# Patient Record
Sex: Male | Born: 1975
Health system: Southern US, Community
[De-identification: ages and names within clinical notes are randomized; demographics above are authoritative.]

## PROBLEM LIST (undated history)

## (undated) DIAGNOSIS — K219 Gastro-esophageal reflux disease without esophagitis: Secondary | ICD-10-CM

## (undated) HISTORY — PX: FOOT SURGERY: SHX648

---

## 2012-09-27 ENCOUNTER — Encounter (HOSPITAL_COMMUNITY): Payer: Self-pay | Admitting: *Deleted

## 2012-09-27 ENCOUNTER — Emergency Department (HOSPITAL_COMMUNITY): Payer: BC Managed Care – PPO

## 2012-09-27 ENCOUNTER — Emergency Department (HOSPITAL_COMMUNITY)
Admission: EM | Admit: 2012-09-27 | Discharge: 2012-09-27 | Disposition: A | Discharge status: Home Health | Payer: BC Managed Care – PPO | Attending: Emergency Medicine | Admitting: Emergency Medicine

## 2012-09-27 DIAGNOSIS — F17200 Nicotine dependence, unspecified, uncomplicated: Secondary | ICD-10-CM | POA: Insufficient documentation

## 2012-09-27 DIAGNOSIS — Z8719 Personal history of other diseases of the digestive system: Secondary | ICD-10-CM | POA: Insufficient documentation

## 2012-09-27 DIAGNOSIS — Y929 Unspecified place or not applicable: Secondary | ICD-10-CM | POA: Insufficient documentation

## 2012-09-27 DIAGNOSIS — S20212A Contusion of left front wall of thorax, initial encounter: Secondary | ICD-10-CM

## 2012-09-27 DIAGNOSIS — S20219A Contusion of unspecified front wall of thorax, initial encounter: Secondary | ICD-10-CM | POA: Insufficient documentation

## 2012-09-27 DIAGNOSIS — Y939 Activity, unspecified: Secondary | ICD-10-CM | POA: Insufficient documentation

## 2012-09-27 DIAGNOSIS — IMO0002 Reserved for concepts with insufficient information to code with codable children: Secondary | ICD-10-CM | POA: Insufficient documentation

## 2012-09-27 HISTORY — DX: Gastro-esophageal reflux disease without esophagitis: K21.9

## 2012-09-27 MED ORDER — METHOCARBAMOL 500 MG PO TABS: 500.0000 mg | ORAL_TABLET | Freq: Two times a day (BID) | ORAL | Status: DC

## 2012-09-27 MED ORDER — OXYCODONE-ACETAMINOPHEN 5-325 MG PO TABS: 2.0000 | ORAL_TABLET | ORAL | Status: DC | PRN

## 2012-09-27 NOTE — ED Notes (Signed)
Pt discharged to home with family. NAD.  
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
## 2017-05-07 ENCOUNTER — Emergency Department (HOSPITAL_COMMUNITY): Payer: 59

## 2017-05-07 ENCOUNTER — Encounter (HOSPITAL_COMMUNITY): Payer: Self-pay | Admitting: *Deleted

## 2017-05-07 ENCOUNTER — Emergency Department (HOSPITAL_COMMUNITY)
Admission: EM | Admit: 2017-05-07 | Discharge: 2017-05-07 | Disposition: A | Discharge status: Home / Self Care | Payer: 59 | Attending: Emergency Medicine | Admitting: Emergency Medicine

## 2017-05-07 DIAGNOSIS — H93292 Other abnormal auditory perceptions, left ear: Secondary | ICD-10-CM | POA: Insufficient documentation

## 2017-05-07 DIAGNOSIS — H65192 Other acute nonsuppurative otitis media, left ear: Secondary | ICD-10-CM

## 2017-05-07 DIAGNOSIS — R42 Dizziness and giddiness: Secondary | ICD-10-CM

## 2017-05-07 DIAGNOSIS — F17210 Nicotine dependence, cigarettes, uncomplicated: Secondary | ICD-10-CM | POA: Diagnosis not present

## 2017-05-07 DIAGNOSIS — R110 Nausea: Secondary | ICD-10-CM | POA: Diagnosis not present

## 2017-05-07 DIAGNOSIS — R05 Cough: Secondary | ICD-10-CM | POA: Insufficient documentation

## 2017-05-07 DIAGNOSIS — R2681 Unsteadiness on feet: Secondary | ICD-10-CM | POA: Diagnosis not present

## 2017-05-07 DIAGNOSIS — R531 Weakness: Secondary | ICD-10-CM | POA: Diagnosis not present

## 2017-05-07 DIAGNOSIS — K625 Hemorrhage of anus and rectum: Secondary | ICD-10-CM | POA: Diagnosis not present

## 2017-05-07 LAB — CBC
HCT: 42.6 % (ref 39.0–52.0)
Hemoglobin: 14.3 g/dL (ref 13.0–17.0)
MCH: 29.5 pg (ref 26.0–34.0)
MCHC: 33.6 g/dL (ref 30.0–36.0)
MCV: 88 fL (ref 78.0–100.0)
Platelets: 179 10*3/uL (ref 150–400)
RBC: 4.84 MIL/uL (ref 4.22–5.81)
RDW: 13.3 % (ref 11.5–15.5)
WBC: 6.3 10*3/uL (ref 4.0–10.5)

## 2017-05-07 LAB — URINALYSIS, ROUTINE W REFLEX MICROSCOPIC
BILIRUBIN URINE: NEGATIVE
Glucose, UA: NEGATIVE mg/dL
HGB URINE DIPSTICK: NEGATIVE
Ketones, ur: NEGATIVE mg/dL
Leukocytes, UA: NEGATIVE
Nitrite: NEGATIVE
Protein, ur: NEGATIVE mg/dL
SPECIFIC GRAVITY, URINE: 1.005 (ref 1.005–1.030)
pH: 6 (ref 5.0–8.0)

## 2017-05-07 LAB — BASIC METABOLIC PANEL
Anion gap: 8 (ref 5–15)
BUN: 12 mg/dL (ref 6–20)
CALCIUM: 9.6 mg/dL (ref 8.9–10.3)
CO2: 21 mmol/L — AB (ref 22–32)
CREATININE: 0.8 mg/dL (ref 0.61–1.24)
Chloride: 107 mmol/L (ref 101–111)
GFR calc Af Amer: >60 mL/min (ref >60)
GFR calc non Af Amer: >60 mL/min (ref >60)
Glucose, Bld: 104 mg/dL — ABNORMAL HIGH (ref 65–99)
Potassium: 4 mmol/L (ref 3.5–5.1)
Sodium: 136 mmol/L (ref 135–145)

## 2017-05-07 MED ORDER — MECLIZINE HCL 25 MG PO TABS: 25.0000 mg | ORAL_TABLET | Freq: Three times a day (TID) | ORAL | 0 refills | Status: DC | PRN

## 2017-05-07 MED ORDER — DIAZEPAM 5 MG PO TABS: 5.0000 mg | ORAL_TABLET | Freq: Two times a day (BID) | ORAL | 0 refills | Status: DC | PRN

## 2017-05-07 MED ORDER — MECLIZINE HCL 25 MG PO TABS
50.0000 mg | ORAL_TABLET | Freq: Once | ORAL | Status: AC
  Administered 2017-05-07: 50 mg via ORAL
  Filled 2017-05-07: qty 2

## 2017-05-07 MED ORDER — AMOXICILLIN-POT CLAVULANATE 875-125 MG PO TABS: 1.0000 | ORAL_TABLET | Freq: Two times a day (BID) | ORAL | 0 refills | Status: AC

## 2017-05-07 MED ORDER — SODIUM CHLORIDE 0.9 % IV BOLUS (SEPSIS)
1000.0000 mL | Freq: Once | INTRAVENOUS | Status: AC
  Administered 2017-05-07: 1000 mL via INTRAVENOUS

## 2017-05-07 MED ORDER — ONDANSETRON HCL 4 MG/2ML IJ SOLN
4.0000 mg | Freq: Once | INTRAMUSCULAR | Status: AC
  Administered 2017-05-07: 4 mg via INTRAVENOUS
  Filled 2017-05-07: qty 2

## 2017-05-07 NOTE — Discharge Instructions (Addendum)
Fill the Augmentin prescription if you are having continuing ear symptoms in 2 days.

## 2017-05-07 NOTE — ED Notes (Signed)
Pt. currently at MRI .  

## 2017-05-07 NOTE — ED Notes (Signed)
Pt A&Ox4, ambulatory at d/c with independent steady gait, NAD. Pt verbalized understanding of d/c instructions and follow up care. 

## 2017-05-07 NOTE — ED Notes (Signed)
Pt has now returned from MRI

## 2017-05-07 NOTE — ED Provider Notes (Signed)
[facility] Provider Note   CSN: [id number] Arrival date & time: 05/07/17  [id number]     History   Chief Complaint Chief Complaint  Patient presents with  . Dizziness    HPI Ernest Lawson is a 41 y.o. male.  HPI  Presents with concern for dizziness starting yesterday around 2 PM. Dizziness feels like can't keep balance, like equilibrium off.  Walking seems to make it worse.  Not really worse with head movements, standing. Feels the same when laying down as well.  Feels some lightheadedness.  Not as intense as it was when first feeling it this morning.    Left hand falling asleep last night but was laying on that side. Other than that no numbness, weakness, difficulty speaking.  Can walk but feels lightheadedness with walking.  No change in vision.  No ear pain but having muffled sensation in left ear.   No chest pain, no dyspnea, no vomiting but did have some nausea, no headache. No fevers, black stool.  Bright red blood on tissue paper and in toilet bowl, not mixed in with stool. No diarrhea.  No medication changes, only takes nexium. Drinks alcohol, but not prior to this (on Sunday), no drugs. Smokes cigarettes  Dad has hx of COPD and stroke hx in his 5550s   Past Medical History:  Diagnosis Date  . Acid reflux     There are no active problems to display for this patient.   Past Surgical History:  Procedure Laterality Date  . FOOT SURGERY         Home Medications    Prior to Admission medications   Medication Sig Start Date End Date Taking? Authorizing Provider  amoxicillin-clavulanate (AUGMENTIN) 875-125 MG tablet Take 1 tablet 2 (two) times daily for 7 days by mouth. 05/07/17 05/14/17  Alvira MondaySchlossman, Shatona Andujar, MD  diazepam (VALIUM) 5 MG tablet Take 1 tablet (5 mg total) 2 (two) times daily as needed by mouth (dizziness). 05/07/17   Alvira MondaySchlossman, Kyl Givler, MD  esomeprazole (NEXIUM) 20 MG capsule Take 20 mg by mouth daily before breakfast.     [provider]  ibuprofen (ADVIL,MOTRIN) 200 MG tablet Take 200 mg by mouth every 6 (six) hours as needed for pain.    [provider]  meclizine (ANTIVERT) 25 MG tablet Take 1 tablet (25 mg total) 3 (three) times daily as needed by mouth for dizziness. 05/07/17   Alvira MondaySchlossman, Akeila Lana, MD  methocarbamol (ROBAXIN) 500 MG tablet Take 1 tablet (500 mg total) by mouth 2 (two) times daily. 09/27/12   Lorre NickAllen, Anthony, MD  oxyCODONE-acetaminophen (PERCOCET/ROXICET) 5-325 MG per tablet Take 2 tablets by mouth every 4 (four) hours as needed for pain. 09/27/12   Lorre NickAllen, Anthony, MD    Family History History reviewed. No pertinent family history.  Social History Social History   Tobacco Use  . Smoking status: Current Every Day Smoker  Substance Use Topics  . Alcohol use: Yes  . Drug use: No     Allergies   Patient has no known allergies.   Review of Systems Review of Systems  Constitutional: Negative for fever.  HENT: Positive for congestion. Negative for ear pain (no ear pain but has muffled sensation), sinus pain and sore throat.   Eyes: Negative for visual disturbance.  Respiratory: Positive for cough. Negative for shortness of breath.   Cardiovascular: Negative for chest pain.  Gastrointestinal: Positive for anal bleeding and nausea. Negative for abdominal pain, constipation, diarrhea and vomiting.  Genitourinary: Negative  for difficulty urinating and dysuria.  Musculoskeletal: Negative for back pain and neck stiffness.  Skin: Negative for rash.  Neurological: Positive for dizziness and light-headedness. Negative for syncope, speech difficulty, weakness, numbness and headaches.     Physical Exam Updated Vital Signs BP 116/84   Pulse 65   Temp 98.6 F (37 C) (Oral)   Resp 20   SpO2 99%   Physical Exam  Constitutional: He is oriented to person, place, and time. He appears well-developed and well-nourished. No distress.  HENT:  Head: Normocephalic and atraumatic.   TM with effusion on left  Eyes: Conjunctivae and EOM are normal.  Neck: Normal range of motion.  Cardiovascular: Normal rate, regular rhythm, normal heart sounds and intact distal pulses. Exam reveals no gallop and no friction rub.  No murmur heard. Pulmonary/Chest: Effort normal and breath sounds normal. No respiratory distress. He has no wheezes. He has no rales.  Abdominal: Soft. He exhibits no distension. There is no tenderness. There is no guarding.  Musculoskeletal: He exhibits no edema.  Neurological: He is alert and oriented to person, place, and time. He has normal strength. No cranial nerve deficit or sensory deficit. He displays a negative Romberg sign. Coordination and gait normal. GCS eye subscore is 4. GCS verbal subscore is 5. GCS motor subscore is 6.  Skin: Skin is warm and dry. He is not diaphoretic.  Nursing note and vitals reviewed.    ED Treatments / Results  Labs (all labs ordered are listed, but only abnormal results are displayed) Labs Reviewed  BASIC METABOLIC PANEL - Abnormal; Notable for the following components:      Result Value   CO2 21 (*)    Glucose, Bld 104 (*)    All other components within normal limits  URINALYSIS, ROUTINE W REFLEX MICROSCOPIC - Abnormal; Notable for the following components:   Color, Urine STRAW (*)    All other components within normal limits  CBC  CBG MONITORING, ED    EKG  EKG Interpretation  Date/Time:  Wednesday May 07 2017 09:05:35 EST Ventricular Rate:  83 PR Interval:  154 QRS Duration: 88 QT Interval:  356 QTC Calculation: 418 R Axis:   59 Text Interpretation:  Normal sinus rhythm Normal ECG No significant change since last tracing Confirmed by Alvira MondaySchlossman, Sueo Cullen ([id number]) on 05/07/2017 9:25:40 AM       Radiology Mr Brain Wo Contrast  Result Date: 05/07/2017 CLINICAL DATA:  Vertigo EXAM: MRI HEAD WITHOUT CONTRAST TECHNIQUE: Multiplanar, multiecho pulse sequences of the brain and surrounding structures were  obtained without intravenous contrast. COMPARISON:  None. FINDINGS: Brain: No acute infarction, hemorrhage, hydrocephalus, extra-axial collection or mass lesion. Solitary 4 mm left parietal deep white matter hyperintensity. Otherwise white matter normal. Vascular: Normal arterial flow voids Skull and upper cervical spine: 10 mm cyst in the left parietal bone has a benign appearance. Sinuses/Orbits: Negative Other: None IMPRESSION: No acute abnormality. Small solitary white matter hyperintensity on left is nonspecific. Differential includes chronic ischemia, complicated migraine headache, and demyelinating disease. Electronically Signed   By: Marlan Palauharles  Clark M.D.   On: 05/07/2017 13:38    Procedures Procedures (including critical care time)  Medications Ordered in ED Medications  sodium chloride 0.9 % bolus 1,000 mL (0 mLs Intravenous Stopped 05/07/17 1154)  meclizine (ANTIVERT) tablet 50 mg (50 mg Oral Given 05/07/17 1007)  ondansetron (ZOFRAN) injection 4 mg (4 mg Intravenous Given 05/07/17 1011)     Initial Impression / Assessment and Plan / ED Course  I  have reviewed the triage vital signs and the nursing notes.  Pertinent labs & imaging results that were available during my care of the patient were reviewed by me and considered in my medical decision making (see chart for details).     40 year old male with a history of GERD presents with concern for sensation of disequilibrium and dizziness.  EKG shows no acute findings.  No acute changes on telemetry.  No chest pain or shortness of breath and have low suspicion for pulmonary embolus or ACS.  CBC shows a normal hemoglobin.  Electrolytes are within normal limits.  Patient was given meclizine and a liter of fluid, and on reevaluation continues to have sensation of dizziness, disequilibrium, and felt wobbly while trying to ambulate.  While initial Neuro exam was normal, appeared unsteady while he was ambulating to bathroom, and given he does not  have classic peripheral vertigo symptoms (no severe room spinning, no worsening with standing or head movements, continued symptoms at rest) and that his symptoms did not improve with meclizine, ordered MRI to evaluate for potential central etiology of vertigo/dysequilibrium.  MRI Brain shows no acute abnormalities.  Does show a small solitary white matter hyperintensity on the left which is nonspecific.  Given this finding, recommend outpatient neurology follow-up.  Symptoms of disequilibrium are likely secondary to a peripheral vertigo given no signs of other abnormalities on MRI.  He does have nasal congestion and signs of a left ear effusion.  Given Augmentin to take if he develops ear pain, fevers or has continuing symptoms in 48 hours for possible bacterial acute otitis media.  At this time suspect more likely viral etiology.  Gave a prescription for meclizine, as well as 4 tablets of Valium to use as needed for vertigo if it continues after meclizine use.  Recommend follow-up with primary care physician as well as neurology given left parietal deep white matter hyperintensity. Patient discharged in stable condition with understanding of reasons to return.     Final Clinical Impressions(s) / ED Diagnoses   Final diagnoses:  Vertigo  Acute effusion of left ear    ED Discharge Orders        Ordered    amoxicillin-clavulanate (AUGMENTIN) 875-125 MG tablet  2 times daily     05/07/17 1442    meclizine (ANTIVERT) 25 MG tablet  3 times daily PRN     05/07/17 1442    diazepam (VALIUM) 5 MG tablet  2 times daily PRN     05/07/17 1442       Alvira Monday, MD 05/07/17 1820

## 2017-05-07 NOTE — ED Triage Notes (Signed)
Pt reports dizziness that started yesterday. Denies any pain just feels weak and feels like "room is spinning" and unsteady on his feet.

## 2017-05-08 ENCOUNTER — Encounter: Payer: Self-pay | Admitting: Neurology

## 2017-07-08 DIAGNOSIS — Z23 Encounter for immunization: Secondary | ICD-10-CM | POA: Diagnosis not present

## 2017-08-19 ENCOUNTER — Encounter: Payer: Self-pay | Admitting: Neurology

## 2017-08-19 ENCOUNTER — Ambulatory Visit: Payer: 59 | Admitting: Neurology

## 2017-08-19 VITALS — HR 83 | Ht 70.0 in | Wt 236.2 lb

## 2017-08-19 DIAGNOSIS — G44219 Episodic tension-type headache, not intractable: Secondary | ICD-10-CM | POA: Diagnosis not present

## 2017-08-19 DIAGNOSIS — H8110 Benign paroxysmal vertigo, unspecified ear: Secondary | ICD-10-CM

## 2017-08-19 DIAGNOSIS — F17200 Nicotine dependence, unspecified, uncomplicated: Secondary | ICD-10-CM

## 2017-08-19 NOTE — Progress Notes (Signed)
NEUROLOGY CONSULTATION NOTE  Ernest Lawson MRN: [id number] DOB: 1975-12-29  Referring provider: ED referral Primary care provider: no PCP  Reason for consult:  Dizziness, abnormal brain MRI  HISTORY OF PRESENT ILLNESS: Ernest Lawson is a 42 year old male who presents for vertigo.  History supplemented by ED note.  On 05/06/17, he developed dizziness, described as a spinning and lightheaded sensation.  It was most pronounced when he was working on the ladder or looking up.  It lasted several seconds to a couple of minutes.  He had some mild nausea but no vomiting.  Otherwise, he denied unilateral numbness, weakness, visual disturbance or slurred speech.  He has occasional non-throbbing dull frontal headaches that occur about once a week or less and quickly responds to over the counter medication.  However, these dizzy spells were not associated with headache.  He denies history of migraines.  He presented to the ED the following day.  MRI of brain without contrast was personally reviewed and revealed a nonspecific punctate solitary hyperintense foci in the left parietal cerebral white matter.  Otherwise, unremarkable.  EKG was unremarkable.  He noted nasal congestion and exhibited signs of left ear effusion.  He was prescribed Augmentin if he develops ear pain, fever or continued symptoms for 48 hours.  The dizziness lasted several weeks and then subsided.  Once in a while, he notes mild dizziness when he looks up while working.    For several years, he has had intermittent left proximal upper extremity pain that would radiated down the arm and associated with numbness.  No weakness or neck pain.  He said it started after injuring the left side of his upper chest.    PAST MEDICAL HISTORY: Past Medical History:  Diagnosis Date  . Acid reflux     PAST SURGICAL HISTORY: Past Surgical History:  Procedure Laterality Date  . FOOT SURGERY      MEDICATIONS: Current Outpatient Medications on  File Prior to Visit  Medication Sig Dispense Refill  . aspirin EC 81 MG tablet Take 81 mg by mouth daily.    . diazepam (VALIUM) 5 MG tablet Take 1 tablet (5 mg total) 2 (two) times daily as needed by mouth (dizziness). (Patient not taking: Reported on 08/19/2017) 4 tablet 0  . esomeprazole (NEXIUM) 20 MG capsule Take 20 mg by mouth daily before breakfast.    . ibuprofen (ADVIL,MOTRIN) 200 MG tablet Take 200 mg by mouth every 6 (six) hours as needed for pain.    . meclizine (ANTIVERT) 25 MG tablet Take 1 tablet (25 mg total) 3 (three) times daily as needed by mouth for dizziness. (Patient not taking: Reported on 08/19/2017) 30 tablet 0  . methocarbamol (ROBAXIN) 500 MG tablet Take 1 tablet (500 mg total) by mouth 2 (two) times daily. (Patient not taking: Reported on 08/19/2017) 20 tablet 0  . oxyCODONE-acetaminophen (PERCOCET/ROXICET) 5-325 MG per tablet Take 2 tablets by mouth every 4 (four) hours as needed for pain. (Patient not taking: Reported on 08/19/2017) 15 tablet 0   No current facility-administered medications on file prior to visit.     ALLERGIES: No Known Allergies  FAMILY HISTORY: Family History  Problem Relation Age of Onset  . Throat cancer Mother   . High blood pressure Mother   . Stroke Father     SOCIAL HISTORY: Social History   Socioeconomic History  . Marital status: Married    Spouse name: Judeth Cornfield  . Number of children: Not on file  . Years  of education: Not on file  . Highest education level: 11th grade  Social Needs  . Financial resource strain: Not on file  . Food insecurity - worry: Not on file  . Food insecurity - inability: Not on file  . Transportation needs - medical: Not on file  . Transportation needs - non-medical: Not on file  Occupational History  . Occupation: pipe fitter    CommentHydrologist con [employer]  . Smoking status: Current Every Day Smoker  . Smokeless tobacco: Never Used  Substance and Sexual Activity  . Alcohol use: Yes     Alcohol/week: 1.2 oz    Types: 1 Cans of beer, 1 Shots of liquor per week    Comment: 1-2 beers or cocktails a week  . Drug use: No  . Sexual activity: Not on file  Other Topics Concern  . Not on file  Social History Narrative   Patient is right-handed. He is married with 2 children. He drinks 2 cups of coffee a day, and occassionally tea or soda, He is very active at work.    REVIEW OF SYSTEMS: Constitutional: No fevers, chills, or sweats, no generalized fatigue, change in appetite Eyes: No visual changes, double vision, eye pain Ear, nose and throat: No hearing loss, ear pain, nasal congestion, sore throat Cardiovascular: No chest pain, palpitations Respiratory:  No shortness of breath at rest or with exertion, wheezes GastrointestinaI: No nausea, vomiting, diarrhea, abdominal pain, fecal incontinence Genitourinary:  No dysuria, urinary retention or frequency Musculoskeletal:  No neck pain, back pain Integumentary: No rash, pruritus, skin lesions Neurological: as above Psychiatric: No depression, insomnia, anxiety Endocrine: No palpitations, fatigue, diaphoresis, mood swings, change in appetite, change in weight, increased thirst Hematologic/Lymphatic:  No purpura, petechiae. Allergic/Immunologic: no itchy/runny eyes, nasal congestion, recent allergic reactions, rashes  PHYSICAL EXAM: Vitals:   08/19/17 0801  Pulse: 83  SpO2: 98%   General: No acute distress.  Patient appears well-groomed.  Head:  Normocephalic/atraumatic Eyes:  fundi examined but not visualized Neck: supple, no paraspinal tenderness, full range of motion Back: No paraspinal tenderness Heart: regular rate and rhythm Lungs: Clear to auscultation bilaterally. Vascular: No carotid bruits. Neurological Exam: Mental status: alert and oriented to person, place, and time, recent and remote memory intact, fund of knowledge intact, attention and concentration intact, speech fluent and not dysarthric, language  intact. Cranial nerves: CN I: not tested CN II: pupils equal, round and reactive to light, visual fields intact CN III, IV, VI:  full range of motion, no nystagmus, no ptosis CN V: facial sensation intact CN VII: upper and lower face symmetric CN VIII: hearing intact CN IX, X: gag intact, uvula midline CN XI: sternocleidomastoid and trapezius muscles intact CN XII: tongue midline Bulk & Tone: normal, no fasciculations. Motor:  5/5 throughout  Sensation: temperature and vibration sensation intact. Deep Tendon Reflexes:  2+ throughout, toes downgoing.  Finger to nose testing:  Without dysmetria.  Heel to shin:  Without dysmetria.  Gait:  Normal station and stride.  Able to turn and tandem walk. Romberg negative.  IMPRESSION: 1.  Benign paroxysmal positional vertigo, stable.   2.  Abnormal white matter foci on brain MRI is likely of no clinical significance.  It is nonspecific and not concerning for demyelinating disease, infection or vasculitis.  Possibly secondary to history of tobacco use.  With normal neurologic exam, no further workup warranted. 3.  Episodic tension-type headache, stable 4.  Tobacco use disorder  PLAN: No further workup needed.  Tobacco cessation counseling (CPT 99406): Tobacco use with no history of CAD, stroke, or cancer - Currently smoking 1 packs/day   - Patient was informed of the dangers of tobacco abuse including stroke, cancer, and MI, as well as benefits of tobacco cessation. - Patient is not willing to quit at this time. - Approximately 3 mins were spent counseling patient cessation techniques. We discussed various methods to help quit smoking, including deciding on a date to quit, joining a support group, pharmacological agents- nicotine gum/patch/lozenges, chantix.   Shon MilletAdam Samella Lucchetti, DO

## 2017-08-19 NOTE — Patient Instructions (Signed)
The dizziness is vertigo due to inner ear dysfunction.  T The MRI of brain shows a tiny spot of unknown clinical significance but does not look concerning.  There is also a small cyst which is benign and nothing concerning.  No further workup is needed.  As discussed, continue trying to quit smoking.

## 2017-08-21 ENCOUNTER — Ambulatory Visit: Payer: 59 | Admitting: Neurology

## 2018-01-27 DIAGNOSIS — R0789 Other chest pain: Secondary | ICD-10-CM | POA: Diagnosis not present

## 2018-03-12 DIAGNOSIS — H6123 Impacted cerumen, bilateral: Secondary | ICD-10-CM | POA: Diagnosis not present

## 2018-03-12 DIAGNOSIS — H81399 Other peripheral vertigo, unspecified ear: Secondary | ICD-10-CM | POA: Diagnosis not present

## 2018-03-12 DIAGNOSIS — H903 Sensorineural hearing loss, bilateral: Secondary | ICD-10-CM | POA: Diagnosis not present

## 2018-03-27 DIAGNOSIS — Z23 Encounter for immunization: Secondary | ICD-10-CM | POA: Diagnosis not present

## 2018-10-30 DIAGNOSIS — R0600 Dyspnea, unspecified: Secondary | ICD-10-CM | POA: Diagnosis not present

## 2018-10-30 DIAGNOSIS — J302 Other seasonal allergic rhinitis: Secondary | ICD-10-CM | POA: Diagnosis not present

## 2018-10-30 DIAGNOSIS — Z6834 Body mass index (BMI) 34.0-34.9, adult: Secondary | ICD-10-CM | POA: Diagnosis not present

## 2018-11-04 DIAGNOSIS — E785 Hyperlipidemia, unspecified: Secondary | ICD-10-CM | POA: Diagnosis not present

## 2018-11-04 DIAGNOSIS — Z6834 Body mass index (BMI) 34.0-34.9, adult: Secondary | ICD-10-CM | POA: Diagnosis not present

## 2018-11-04 DIAGNOSIS — R079 Chest pain, unspecified: Secondary | ICD-10-CM | POA: Diagnosis not present

## 2018-11-04 DIAGNOSIS — R0600 Dyspnea, unspecified: Secondary | ICD-10-CM | POA: Diagnosis not present

## 2018-11-05 ENCOUNTER — Telehealth: Payer: Self-pay | Admitting: *Deleted

## 2018-11-05 NOTE — Telephone Encounter (Signed)
YOUR CARDIOLOGY TEAM HAS ARRANGED FOR AN E-VISIT FOR YOUR APPOINTMENT - PLEASE REVIEW IMPORTANT INFORMATION BELOW SEVERAL DAYS PRIOR TO YOUR APPOINTMENT  Due to the recent COVID-19 pandemic, we are transitioning in-person office visits to tele-medicine visits in an effort to decrease unnecessary exposure to our patients, their families, and staff. These visits are billed to your insurance just like a normal visit is. We also encourage you to sign up for MyChart if you have not already done so. You will need a smartphone if possible. For patients that do not have this, we can still complete the visit using a regular telephone but do prefer a smartphone to enable video when possible. You may have a family member that lives with you that can help. If possible, we also ask that you have a blood pressure cuff and scale at home to measure your blood pressure, heart rate and weight prior to your scheduled appointment. Patients with clinical needs that need an in-person evaluation and testing will still be able to come to the office if absolutely necessary. If you have any questions, feel free to call our office.     YOUR PROVIDER WILL BE USING THE FOLLOWING PLATFORM TO COMPLETE YOUR VISIT: Staff: Please delete this text and fill in MyChart/Doximity/[email or web address].Me  . IF USING MYCHART - How to Download the MyChart App to Your SmartPhone   - If Apple, go to App Store and type in MyChart in the search bar and download the app. If Android, ask patient to go to Google Play Store and type in MyChart in the search bar and download the app. The app is free but as with any other app downloads, your phone may require you to verify saved payment information or Apple/Android password.  - You will need to then log into the app with your MyChart username and password, and select [facility] as your healthcare provider to link the account.  - When it is time for your visit, go to the MyChart app, find appointments, and click Begin  Video Visit. Be sure to Select Allow for your device to access the Microphone and Camera for your visit. You will then be connected, and your provider will be with you shortly.  **If you have any issues connecting or need assistance, please contact MyChart service desk [phone] ([phone])**  **If using a computer, in order to ensure the best quality for your visit, you will need to use either of the following Internet Browsers: Google Chrome or Microsoft Edge**  . IF USING DOXIMITY or [email or web address].ME - The staff will give you instructions on receiving your link to join the meeting the day of your visit.      2-3 DAYS BEFORE YOUR APPOINTMENT  You will receive a telephone call from one of our [facility] team members - your caller ID may say "Unknown caller." If this is a video visit, we will walk you through how to get the video launched on your phone. We will remind you check your blood pressure, heart rate and weight prior to your scheduled appointment. If you have an Apple Watch or Kardia, please upload any pertinent ECG strips the day before or morning of your appointment to MyChart. Our staff will also make sure you have reviewed the consent and agree to move forward with your scheduled tele-health visit.     THE DAY OF YOUR APPOINTMENT  Approximately 15 minutes prior to your scheduled appointment, you will receive a telephone call from one of HeartCare team -   your caller ID may say "Unknown caller."  Our staff will confirm medications, vital signs for the day and any symptoms you may be experiencing. Please have this information available prior to the time of visit start. It may also be helpful for you to have a pad of paper and pen handy for any instructions given during your visit. They will also walk you through joining the smartphone meeting if this is a video visit.    CONSENT FOR TELE-HEALTH VISIT - PLEASE REVIEW  I hereby voluntarily request, consent and authorize [facility] and  its employed or contracted physicians, physician assistants, nurse practitioners or other licensed health care professionals (the Practitioner), to provide me with telemedicine health care services (the "Services") as deemed necessary by the treating Practitioner. I acknowledge and consent to receive the Services by the Practitioner via telemedicine. I understand that the telemedicine visit will involve communicating with the Practitioner through live audiovisual communication technology and the disclosure of certain medical information by electronic transmission. I acknowledge that I have been given the opportunity to request an in-person assessment or other available alternative prior to the telemedicine visit and am voluntarily participating in the telemedicine visit.  I understand that I have the right to withhold or withdraw my consent to the use of telemedicine in the course of my care at any time, without affecting my right to future care or treatment, and that the Practitioner or I may terminate the telemedicine visit at any time. I understand that I have the right to inspect all information obtained and/or recorded in the course of the telemedicine visit and may receive copies of available information for a reasonable fee.  I understand that some of the potential risks of receiving the Services via telemedicine include:  Marland Kitchen Delay or interruption in medical evaluation due to technological equipment failure or disruption; . Information transmitted may not be sufficient (e.g. poor resolution of images) to allow for appropriate medical decision making by the Practitioner; and/or  . In rare instances, security protocols could fail, causing a breach of personal health information.  Furthermore, I acknowledge that it is my responsibility to provide information about my medical history, conditions and care that is complete and accurate to the best of my ability. I acknowledge that Practitioner's advice,  recommendations, and/or decision may be based on factors not within their control, such as incomplete or inaccurate data provided by me or distortions of diagnostic images or specimens that may result from electronic transmissions. I understand that the practice of medicine is not an exact science and that Practitioner makes no warranties or guarantees regarding treatment outcomes. I acknowledge that I will receive a copy of this consent concurrently upon execution via email to the email address I last provided but may also request a printed copy by calling the office of [facility].   Pt gives consent to virtual visit. I understand that my insurance will be billed for this visit.   I have read or had this consent read to me. . I understand the contents of this consent, which adequately explains the benefits and risks of the Services being provided via telemedicine.  . I have been provided ample opportunity to ask questions regarding this consent and the Services and have had my questions answered to my satisfaction. . I give my informed consent for the services to be provided through the use of telemedicine in my medical care  By participating in this telemedicine visit I agree to the  above.  Cardiac Questionnaire:    Since your last visit or hospitalization:    1. Have you been having new or worsening chest pain? yes   2. Have you been having new or worsening shortness of breath? yes 3. Have you been having new or worsening leg swelling, wt gain, or increase in abdominal girth (pants fitting more tightly)? no   4. Have you had any passing out spells? no    *A YES to any of these questions would result in the appointment being kept. *If all the answers to these questions are NO, we should indicate that given the current situation regarding the worldwide coronarvirus pandemic, at the recommendation of the CDC, we are looking to limit gatherings in our waiting area, and thus will reschedule  their appointment beyond four weeks from today.   _____________   COVID-19 Pre-Screening Questions:  . Do you currently have a fever? no (yes = cancel and refer to pcp for e-visit) . Have you recently travelled on a cruise, internationally, or to [place], [place], [place], [place], [place], or [place] [employer] ? no (yes = cancel, stay home, monitor symptoms, and contact pcp or initiate e-visit if symptoms develop) . Have you been in contact with someone that is currently pending confirmation of Covid19 testing or has been confirmed to have the Covid19 virus?  no (yes = cancel, stay home, away from tested individual, monitor symptoms, and contact pcp or initiate e-visit if symptoms develop) . Are you currently experiencing fatigue or cough? no (yes = pt should be prepared to have a mask placed at the time of their visit).

## 2018-11-09 ENCOUNTER — Encounter: Payer: Self-pay | Admitting: Cardiology

## 2018-11-09 ENCOUNTER — Telehealth (INDEPENDENT_AMBULATORY_CARE_PROVIDER_SITE_OTHER): Payer: 59 | Admitting: Cardiology

## 2018-11-09 ENCOUNTER — Other Ambulatory Visit: Payer: Self-pay

## 2018-11-09 DIAGNOSIS — R0789 Other chest pain: Secondary | ICD-10-CM

## 2018-11-09 DIAGNOSIS — R0683 Snoring: Secondary | ICD-10-CM

## 2018-11-09 DIAGNOSIS — I10 Essential (primary) hypertension: Secondary | ICD-10-CM

## 2018-11-09 DIAGNOSIS — R0609 Other forms of dyspnea: Secondary | ICD-10-CM | POA: Insufficient documentation

## 2018-11-09 HISTORY — DX: Other chest pain: R07.89

## 2018-11-09 HISTORY — DX: Snoring: R06.83

## 2018-11-09 HISTORY — DX: Other forms of dyspnea: R06.09

## 2018-11-09 HISTORY — DX: Essential (primary) hypertension: I10

## 2018-11-09 MED ORDER — METOPROLOL SUCCINATE ER 25 MG PO TB24: 25.0000 mg | ORAL_TABLET | Freq: Every day | ORAL | 1 refills | Status: DC

## 2018-11-09 NOTE — Progress Notes (Signed)
Virtual Visit via Video Note   This visit type was conducted due to national recommendations for restrictions regarding the COVID-19 Pandemic (e.g. social distancing) in an effort to limit this patient's exposure and mitigate transmission in our community.  Due to his co-morbid illnesses, this patient is at least at moderate risk for complications without adequate follow up.  This format is felt to be most appropriate for this patient at this time.  All issues noted in this document were discussed and addressed.  A limited physical exam was performed with this format.  Please refer to the patient's chart for his consent to telehealth for [facility].  Evaluation Performed:  Follow-up visit  This visit type was conducted due to national recommendations for restrictions regarding the COVID-19 Pandemic (e.g. social distancing).  This format is felt to be most appropriate for this patient at this time.  All issues noted in this document were discussed and addressed.  No physical exam was performed (except for noted visual exam findings with Video Visits).  Please refer to the patient's chart (MyChart message for video visits and phone note for telephone visits) for the patient's consent to telehealth for [facility].  Date:  11/09/2018  ID: Ernest Lawson, DOB [birth year], MRN [id number]   Patient Location: [address] [place] [address]   Provider location:   [facility] [place] [facility]  PCP:  Noni Saupe, MD  Cardiologist:  Gypsy Balsam, MD     Chief Complaint: I have a chest pain for last few weeks  History of Present Illness:    Ernest Lawson is a 43 y.o. male  who presents via audio/video conferencing for a telehealth visit today.  Past medical history significant for borderline hypertension, smoking, family history of premature coronary artery disease.  He was referred to [place] because of chest pain.  For last few weeks he experiencing chest pain during the night he can  wake up in the middle of night with sharp stabbing-like sensation the chest.  About 6 or 7 years ago he had chest injury which was blunt injury and from time to time he gets this kind of pain but this time pain appears to be a little more intense and he also described to have some shortness of breath.  Overall he works in [employer] business required a lot of physical work he said he have to climb stairs and usually does not have difficulty doing it sometimes he gets short of breath while watching second floor.  There is no exertional chest pain tightness squeezing pressure burning chest.  Sensation that he get in the chest he grades as 8 in scale up to 10 and this sensation can last up to a minute or so.  Taking deep breath does not make much difference.  There is no radiation of the sensation.  He takes a muscle relaxant which seems to be helping. Risk factors for coronary artery disease include hypertension which is just being diagnosed, also family history of premature coronary artery disease.  He smokes but quit smoking 2 weeks ago.   The patient does not have symptoms concerning for COVID-19 infection (fever, chills, cough, or new SHORTNESS OF BREATH).    Prior CV studies:   The following studies were reviewed today:  EKG done by primary care physician showed normal sinus rhythm normal P interval no ST-T segment changes     Past Medical History:  Diagnosis Date   Acid reflux  Past Surgical History:  Procedure Laterality Date   FOOT SURGERY       Current Meds  Medication Sig   aspirin EC 81 MG tablet Take 81 mg by mouth daily.   esomeprazole (NEXIUM) 20 MG capsule Take 20 mg by mouth daily before breakfast.   ibuprofen (ADVIL,MOTRIN) 200 MG tablet Take 200 mg by mouth every 6 (six) hours as needed for pain.   methocarbamol (ROBAXIN) 500 MG tablet Take 1 tablet (500 mg total) by mouth 2 (two) times daily.   nitroGLYCERIN (NITROSTAT) 0.4 MG SL tablet Place 1 tablet  under the tongue as needed for chest pain.      Family History: The patient's family history includes High blood pressure in his mother; Stroke in his father; Throat cancer in his mother.   ROS:   Please see the history of present illness.     All other systems reviewed and are negative.   Labs/Other Tests and Data Reviewed:     Recent Labs: No results found for requested labs within last 8760 hours.  Recent Lipid Panel No results found for: CHOL, TRIG, HDL, CHOLHDL, VLDL, LDLCALC, LDLDIRECT    Exam:    Vital Signs:  Ht  (1.778 m)    Wt 230 lb (104.3 kg)    BMI 33.00 kg/m     Wt Readings from Last 3 Encounters:  11/09/18 230 lb (104.3 kg)  08/19/17 236 lb 3.2 oz (107.1 kg)     Well nourished, well developed in no acute distress. Alert awake and x3 he talks to me through the video link.  He is not in any distress.  Is happy to be able to talk to me  Diagnosis for this visit:   1. Atypical chest pain   2. Dyspnea on exertion   3. Essential hypertension      ASSESSMENT & PLAN:    1.  Atypical chest pain not related to exercise happening typically during the night.  I have rather low level suspicion that this is a cardiac pain but I agree with present management which include aspirin.  I will also give him prescription for metoprolol succinate 25 mg daily that should help with the blood pressure as well as potentially with heart pain if truly pain is related to his heart.  I will schedule him for stress test will do exercise echocardiogram.  The reason for that is to rule out potentially coronary artery disease even though I have some low-level suspicion for it.  He does have significant risk factors that so I think it would be reasonable to perform stress test.  In the meantime he is taking aspirin and beta-blocker as well as nitroglycerin as needed.  So far he did not have to take nitroglycerin 2.  Dyspnea on exertion.  Echocardiogram will be done to assess left  ventricular ejection fraction. 3.  Essential hypertension I will just initiate small dose of beta-blocker that should help with the blood pressure as well. 4.  Snoring I suspect he does have sleep apnea and that something that will need to be investigated in the future.  COVID-19 Education: The signs and symptoms of COVID-19 were discussed with the patient and how to seek care for testing (follow up with PCP or arrange E-visit).  The importance of social distancing was discussed today.  Patient Risk:   After full review of this patients clinical status, I feel that they are at least moderate risk at this time.  Time:  Today, I have spent 25 minutes with the patient with telehealth technology discussing pt health issues.  I spent 5 minutes reviewing her chart before the visit.  Visit was finished at 8:39 AM.    Medication Adjustments/Labs and Tests Ordered: Current medicines are reviewed at length with the patient today.  Concerns regarding medicines are outlined above.  No orders of the defined types were placed in this encounter.  Medication changes: No orders of the defined types were placed in this encounter.    Disposition: Follow-up 1 month stress echo and echocardiogram will be done with acuity level 1.  Signed, Valene Villa J. Linda Biehn, MD, [facility] 11/09/2018 8:40 AM    [facility]

## 2018-11-09 NOTE — Patient Instructions (Signed)
Medication Instructions:  Your physician has recommended you make the following change in your medication:  Start: Metoprolol succinate 25 mg daily    If you need a refill on your cardiac medications before your next appointment, please call your pharmacy.   Lab work: None.  If you have labs (blood work) drawn today and your tests are completely normal, you will receive your results only by: Marland Kitchen MyChart Message (if you have MyChart) OR . A paper copy in the mail If you have any lab test that is abnormal or we need to change your treatment, we will call you to review the results.  Testing/Procedures: Your physician has requested that you have an echocardiogram. Echocardiography is a painless test that uses sound waves to create images of your heart. It provides your doctor with information about the size and shape of your heart and how well your heart's chambers and valves are working. This procedure takes approximately one hour. There are no restrictions for this procedure.  Your physician has requested that you have a stress echocardiogram. For further information please visit [email or web address] Please follow instruction sheet as given.  Stress Echo Instructions: -DO NOT take your metoprolol 24 hours before the test.  -DO NOT Eat, drink, or use tobacco products 4 hours before the test.  -Dress prepared to exercise.  -Bring any current prescription medications with you the day of the test.  -If you need to cancel the appointment please notify the office 24 hours in advance to avoid any cancellation fees.  -If you ave any questions please call [phone]   Follow-Up: At [facility], you and your health needs are our priority.  As part of our continuing mission to provide you with exceptional heart care, we have created designated Provider Care Teams.  These Care Teams include your primary Cardiologist (physician) and Advanced Practice Providers (APPs -  Physician Assistants and Nurse  Practitioners) who all work together to provide you with the care you need, when you need it. You will need a follow up appointment in 1 months.  Please call our office 2 months in advance to schedule this appointment.  You may see No primary care provider on file. or another member of our [employer] Provider Team in [place]: Norman Herrlich, MD . Belva Crome, MD  Any Other Special Instructions Will Be Listed Below (If Applicable).  Metoprolol extended-release tablets What is this medicine? METOPROLOL (me TOE proe lole) is a beta-blocker. Beta-blockers reduce the workload on the heart and help it to beat more regularly. This medicine is used to treat high blood pressure and to prevent chest pain. It is also used to after a heart attack and to prevent an additional heart attack from occurring. This medicine may be used for other purposes; ask your health care provider or pharmacist if you have questions. COMMON BRAND NAME(S): toprol, Toprol XL What should I tell my health care provider before I take this medicine? They need to know if you have any of these conditions: -diabetes -heart or vessel disease like slow heart rate, worsening heart failure, heart block, sick sinus syndrome or Raynaud's disease -kidney disease -liver disease -lung or breathing disease, like asthma or emphysema -pheochromocytoma -thyroid disease -an unusual or allergic reaction to metoprolol, other beta-blockers, medicines, foods, dyes, or preservatives -pregnant or trying to get pregnant -breast-feeding How should I use this medicine? Take this medicine by mouth with a glass of water. Follow the directions on the prescription label. Do not crush or chew.  Take this medicine with or immediately after meals. Take your doses at regular intervals. Do not take more medicine than directed. Do not stop taking this medicine suddenly. This could lead to serious heart-related effects. Talk to your pediatrician regarding the  use of this medicine in children. While this drug may be prescribed for children as young as 6 years for selected conditions, precautions do apply. Overdosage: If you think you have taken too much of this medicine contact a poison control center or emergency room at once. NOTE: This medicine is only for you. Do not share this medicine with others. What if I miss a dose? If you miss a dose, take it as soon as you can. If it is almost time for your next dose, take only that dose. Do not take double or extra doses. What may interact with this medicine? This medicine may interact with the following medications: -certain medicines for blood pressure, heart disease, irregular heart beat -certain medicines for depression, like monoamine oxidase (MAO) inhibitors, fluoxetine, or paroxetine -clonidine -dobutamine -epinephrine -isoproterenol -reserpine This list may not describe all possible interactions. Give your health care provider a list of all the medicines, herbs, non-prescription drugs, or dietary supplements you use. Also tell them if you smoke, drink alcohol, or use illegal drugs. Some items may interact with your medicine. What should I watch for while using this medicine? Visit your doctor or health care professional for regular check ups. Contact your doctor right away if your symptoms worsen. Check your blood pressure and pulse rate regularly. Ask your health care professional what your blood pressure and pulse rate should be, and when you should contact them. You may get drowsy or dizzy. Do not drive, use machinery, or do anything that needs mental alertness until you know how this medicine affects you. Do not sit or stand up quickly, especially if you are an older patient. This reduces the risk of dizzy or fainting spells. Contact your doctor if these symptoms continue. Alcohol may interfere with the effect of this medicine. Avoid alcoholic drinks. What side effects may I notice from receiving  this medicine? Side effects that you should report to your doctor or health care professional as soon as possible: -allergic reactions like skin rash, itching or hives -cold or numb hands or feet -depression -difficulty breathing -faint -fever with sore throat -irregular heartbeat, chest pain -rapid weight gain -swollen legs or ankles Side effects that usually do not require medical attention (report to your doctor or health care professional if they continue or are bothersome): -anxiety or nervousness -change in sex drive or performance -dry skin -headache -nightmares or trouble sleeping -short term memory loss -stomach upset or diarrhea -unusually tired This list may not describe all possible side effects. Call your doctor for medical advice about side effects. You may report side effects to FDA at 1-800-FDA-1088. Where should I keep my medicine? Keep out of the reach of children. Store at room temperature between 15 and 30 degrees C (59 and 86 degrees F). Throw away any unused medicine after the expiration date. NOTE: This sheet is a summary. It may not cover all possible information. If you have questions about this medicine, talk to your doctor, pharmacist, or health care provider.  2019 Elsevier/Gold Standard (2013-02-19 14:41:37)   Echocardiogram An echocardiogram is a procedure that uses painless sound waves (ultrasound) to produce an image of the heart. Images from an echocardiogram can provide important information about:  Signs of coronary artery disease (CAD).  Aneurysm detection. An aneurysm is a weak or damaged part of an artery wall that bulges out from the normal force of blood pumping through the body.  Heart size and shape. Changes in the size or shape of the heart can be associated with certain conditions, including heart failure, aneurysm, and CAD.  Heart muscle function.  Heart valve function.  Signs of a past heart attack.  Fluid buildup around the  heart.  Thickening of the heart muscle.  A tumor or infectious growth around the heart valves. Tell a health care provider about:  Any allergies you have.  All medicines you are taking, including vitamins, herbs, eye drops, creams, and over-the-counter medicines.  Any blood disorders you have.  Any surgeries you have had.  Any medical conditions you have.  Whether you are pregnant or may be pregnant. What are the risks? Generally, this is a safe procedure. However, problems may occur, including:  Allergic reaction to dye (contrast) that may be used during the procedure. What happens before the procedure? No specific preparation is needed. You may eat and drink normally. What happens during the procedure?   An IV tube may be inserted into one of your veins.  You may receive contrast through this tube. A contrast is an injection that improves the quality of the pictures from your heart.  A gel will be applied to your chest.  A wand-like tool (transducer) will be moved over your chest. The gel will help to transmit the sound waves from the transducer.  The sound waves will harmlessly bounce off of your heart to allow the heart images to be captured in real-time motion. The images will be recorded on a computer. The procedure may vary among health care providers and hospitals. What happens after the procedure?  You may return to your normal, everyday life, including diet, activities, and medicines, unless your health care provider tells you not to do that. Summary  An echocardiogram is a procedure that uses painless sound waves (ultrasound) to produce an image of the heart.  Images from an echocardiogram can provide important information about the size and shape of your heart, heart muscle function, heart valve function, and fluid buildup around your heart.  You do not need to do anything to prepare before this procedure. You may eat and drink normally.  After the  echocardiogram is completed, you may return to your normal, everyday life, unless your health care provider tells you not to do that. This information is not intended to replace advice given to you by your health care provider. Make sure you discuss any questions you have with your health care provider. Document Released: 06/14/2000 Document Revised: 07/20/2016 Document Reviewed: 07/20/2016 Elsevier Interactive Patient Education  2019 [employer].   Exercise Stress Echocardiogram  An exercise stress echocardiogram is a test to check how well your heart is working. This test uses sound waves (ultrasound) and a computer to make images of your heart before and after exercise. Ultrasound images that are taken before you exercise (your resting echocardiogram) will show how much blood is getting to your heart muscle and how well your heart muscle and heart valves are functioning. During the next part of this test, you will walk on a treadmill or ride a stationary bike to see how exercise affects your heart. While you exercise, the electrical activity of your heart will be monitored with an electrocardiogram (ECG). Your blood pressure will also be monitored. You may have this test if you:  Have chest pain or other symptoms of a heart problem.  Recently had a heart attack or heart surgery.  Have heart valve problems.  Have a condition that causes narrowing of the blood vessels that supply your heart (coronary artery disease).  Have a high risk of heart disease and are starting a new exercise program.  Have a high risk of heart disease and need to have major surgery. Tell a health care provider about:  Any allergies you have.  All medicines you are taking, including vitamins, herbs, eye drops, creams, and over-the-counter medicines.  Any problems you or family members have had with anesthetic medicines.  Any blood disorders you have.  Any surgeries you have had.  Any medical conditions  you have.  Whether you are pregnant or may be pregnant. What are the risks? Generally, this is a safe procedure. However, problems may occur, including:  Chest pain.  Dizziness or light-headedness.  Shortness of breath.  Increased or irregular heartbeat (palpitations).  Nausea or vomiting.  Heart attack (very rare). What happens before the procedure?  Follow instructions from your health care provider about eating or drinking restrictions. You may be asked to avoid all forms of caffeine for 24 hours before your procedure, or as told by your health care provider.  Ask your health care provider about changing or stopping your regular medicines. This is especially important if you are taking diabetes medicines or blood thinners.  If you use an inhaler, bring it with you to the test.  Wear loose, comfortable clothing and walking shoes.  Do notuse any products that contain nicotine or tobacco, such as cigarettes and e-cigarettes, for 4 hours before the test or as told by your health care provider. If you need help quitting, ask your health care provider. What happens during the procedure?  You will take off your clothes from the waist up and put on a hospital gown.  A technician will place electrodes on your chest.  A blood pressure cuff will be placed on your arm.  You will lie down on a table for an ultrasound exam before you exercise. Gel will be rubbed on your chest, and a handheld device (transducer) will be pressed against your chest and moved over your heart.  Then, you will start exercising by walking on a treadmill or pedaling a stationary bicycle.  Your blood pressure and heart rhythm will be monitored while you exercise.  The exercise will gradually get harder or faster.  You will exercise until: ? Your heart reaches a target level. ? You are too tired to continue. ? You cannot continue because of chest pain, weakness, or dizziness.  You will have another  ultrasound exam after you stop exercising. The procedure may vary among health care providers and hospitals. What happens after the procedure?  Your heart rate and blood pressure will be monitored until they return to your normal levels. Summary  An exercise stress echocardiogram is a test that uses ultrasound to check how well your heart works before and after exercise.  Before the test, follow instructions from your health care provider about stopping medications, avoiding nicotine and tobacco, and avoiding certain foods and drinks.  During the test, your blood pressure and heart rhythm will be monitored while you exercise on a treadmill or stationary bicycle. This information is not intended to replace advice given to you by your health care provider. Make sure you discuss any questions you have with your health care provider. Document Released: 06/21/2004 Document  Revised: 02/07/2016 Document Reviewed: 02/07/2016 Elsevier Interactive Patient Education  [employer].

## 2018-11-12 ENCOUNTER — Other Ambulatory Visit: Payer: Self-pay

## 2018-11-12 ENCOUNTER — Ambulatory Visit (INDEPENDENT_AMBULATORY_CARE_PROVIDER_SITE_OTHER): Payer: 59

## 2018-11-12 DIAGNOSIS — R0789 Other chest pain: Secondary | ICD-10-CM

## 2018-11-12 DIAGNOSIS — I10 Essential (primary) hypertension: Secondary | ICD-10-CM | POA: Diagnosis not present

## 2018-11-12 DIAGNOSIS — R0609 Other forms of dyspnea: Secondary | ICD-10-CM

## 2018-11-12 NOTE — Progress Notes (Signed)
Complete echocardiogram has been performed.  Jimmy Iveliz Garay RDCS, RVT 

## 2018-12-10 ENCOUNTER — Telehealth (INDEPENDENT_AMBULATORY_CARE_PROVIDER_SITE_OTHER): Payer: 59 | Admitting: Cardiology

## 2018-12-10 ENCOUNTER — Encounter: Payer: Self-pay | Admitting: Cardiology

## 2018-12-10 ENCOUNTER — Other Ambulatory Visit: Payer: Self-pay

## 2018-12-10 VITALS — Wt 230.0 lb

## 2018-12-10 DIAGNOSIS — R0789 Other chest pain: Secondary | ICD-10-CM | POA: Diagnosis not present

## 2018-12-10 DIAGNOSIS — I10 Essential (primary) hypertension: Secondary | ICD-10-CM

## 2018-12-10 DIAGNOSIS — R0609 Other forms of dyspnea: Secondary | ICD-10-CM

## 2018-12-10 DIAGNOSIS — R0683 Snoring: Secondary | ICD-10-CM

## 2018-12-10 NOTE — Progress Notes (Signed)
Virtual Visit via Video Note   This visit type was conducted due to national recommendations for restrictions regarding the COVID-19 Pandemic (e.g. social distancing) in an effort to limit this patient's exposure and mitigate transmission in our community.  Due to his co-morbid illnesses, this patient is at least at moderate risk for complications without adequate follow up.  This format is felt to be most appropriate for this patient at this time.  All issues noted in this document were discussed and addressed.  A limited physical exam was performed with this format.  Please refer to the patient's chart for his consent to telehealth [facility].  Evaluation Performed:  Follow-up visit  This visit type was conducted due to national recommendations for restrictions regarding the COVID-19 Pandemic (e.g. social distancing).  This format is felt to be most appropriate for this patient at this time.  All issues noted in this document were discussed and addressed.  No physical exam was performed (except for noted visual exam findings with Video Visits).  Please refer to the patient's chart (MyChart message for video visits and phone note for telephone visits) for the patient's consent to telehealth [facility].  Date:  12/10/2018  ID: Caryl NeverScottie Spray, DOB 03/16/1976, MRN [id number]   [place] [address] [place] [id number]   Provider location[facility]   [facility] [place] [facility]  PCP:  Noni Saupeedding, John F. II, MD  Cardiologist:  Gypsy Balsamobert Betzabe Bevans, MD     Chief Complaint: I am feeling better  History of Present Illness:    Jacody Barry DienesOwens is a 43 y.o. male  who presents via audio/video conferencing for a telehealth visit today.  This is a video visit with him I saw him a month ago he does have atypical chest pain.  Echocardiogram has been done which showed preserved left ventricular ejection fraction we are awaiting stress test.  I have low level of suspicion overall that we can find  some significant coronary artery disease and luckily he is feeling good the purpose of the visit is to make sure he is fine since we have delayed with stress testing secondary to coronavirus situation.  I dissipate he stress to stop and be doing within the next month or 2.  He is busy he is actually talking to me of the video link [place]  He said that place required him to walk up and down many times he have to go to the 4 floor and have no major difficulty doing it except shortness of breath.  He quit smoking and I congratulated him for it.   The patient does not have symptoms concerning for COVID-19 infection (fever, chills, cough, or new SHORTNESS OF BREATH).    Prior CV studies:   The following studies were reviewed today:  Echocardiogram showed preserved left ventricular ejection fraction     Past Medical History:  Diagnosis Date  . Acid reflux     Past Surgical History:  Procedure Laterality Date  . FOOT SURGERY       Current Meds  Medication Sig  . aspirin EC 81 MG tablet Take 81 mg by mouth daily.  Marland Kitchen. esomeprazole (NEXIUM) 20 MG capsule Take 20 mg by mouth daily before breakfast.  . ibuprofen (ADVIL,MOTRIN) 200 MG tablet Take 200 mg by mouth every 6 (six) hours as needed for pain.  . methocarbamol (ROBAXIN) 500 MG tablet Take 1 tablet (500 mg total) by mouth 2 (two)  times daily.  . metoprolol succinate (TOPROL-XL) 25 MG 24 hr tablet Take 1 tablet (25 mg total) by mouth daily. Take with or immediately following a meal.  . nitroGLYCERIN (NITROSTAT) 0.4 MG SL tablet Place 1 tablet under the tongue as needed for chest pain.      Family History: The patient's family history includes High blood pressure in his mother; Stroke in his father; Throat cancer in his mother.   ROS:   Please see the history of present illness.     All other systems reviewed and are negative.   Labs/Other Tests and Data Reviewed:     Recent Labs: No results  found for requested labs within last 8760 hours.  Recent Lipid Panel No results found for: CHOL, TRIG, HDL, CHOLHDL, VLDL, LDLCALC, LDLDIRECT    Exam:    Vital Signs:  Wt 230 lb (104.3 kg)   BMI 33.00 kg/m     Wt Readings from Last 3 Encounters:  12/10/18 230 lb (104.3 kg)  11/09/18 230 lb (104.3 kg)  08/19/17 236 lb 3.2 oz (107.1 kg)     Well nourished, well developed in no acute distress. Alert awake and attentive 3 quite happy to be able to talk to me via video link denies having problems.  Diagnosis for this visit:   1. Atypical chest pain   2. Dyspnea on exertion   3. Essential hypertension   4. Snoring      ASSESSMENT & PLAN:    1.  Atypical chest pain awaiting stress test. 2.  Dyspnea on exertion better after he quit smoking echocardiogram showed preserved left ventricular ejection fraction. 3.  Essential hypertension blood pressure well controlled we will continue present management.  4.  Snoring we were talking again about potentially doing a sleep study.  However he still cannot wait until coronavirus situation will stabilize.  COVID-19 Education: The signs and symptoms of COVID-19 were discussed with the patient and how to seek care for testing (follow up with PCP or arrange E-visit).  The importance of social distancing was discussed today.  Patient Risk:   After full review of this patients clinical status, I feel that they are at least moderate risk at this time.  Time:   Today, I have spent 18 minutes with the patient with telehealth technology discussing pt health issues.  I spent 5 minutes reviewing her chart before the visit.  Visit was finished at 8:44 AM.    Medication Adjustments/Labs and Tests Ordered: Current medicines are reviewed at length with the patient today.  Concerns regarding medicines are outlined above.  No orders of the defined types were placed in this encounter.  Medication changes: No orders of the defined types were placed in  this encounter.    Disposition: Follow-up in 3 months.  Awaiting stress test.  Signed, Park Liter, MD, [facility] 12/10/2018 8:46 AM    [place]

## 2018-12-10 NOTE — Patient Instructions (Signed)
Medication Instructions:  Your physician recommends that you continue on your current medications as directed. Please refer to the Current Medication list given to you today.  If you need a refill on your cardiac medications before your next appointment, please call your pharmacy.   Lab work: None.  If you have labs (blood work) drawn today and your tests are completely normal, you will receive your results only by: . MyChart Message (if you have MyChart) OR . A paper copy in the mail If you have any lab test that is abnormal or we need to change your treatment, we will call you to review the results.  Testing/Procedures: None.   Follow-Up: At [facility], you and your health needs are our priority.  As part of our continuing mission to provide you with exceptional heart care, we have created designated Provider Care Teams.  These Care Teams include your primary Cardiologist (physician) and Advanced Practice Providers (APPs -  Physician Assistants and Nurse Practitioners) who all work together to provide you with the care you need, when you need it. You will need a follow up appointment in 3 months.  Please call our office 2 months in advance to schedule this appointment.  You may see No primary care provider on file. or another member of our [facility] Provider Team in [place]: Brian Munley, MD . Rajan Revankar, MD  Any Other Special Instructions Will Be Listed Below (If Applicable).     

## 2019-03-24 ENCOUNTER — Other Ambulatory Visit: Payer: Self-pay

## 2019-03-24 ENCOUNTER — Telehealth: Payer: Self-pay | Admitting: Cardiology

## 2019-04-29 ENCOUNTER — Other Ambulatory Visit: Payer: Self-pay | Admitting: Cardiology

## 2019-04-30 NOTE — Telephone Encounter (Signed)
90 day supply.   Office visit for further refills.

## 2019-07-15 ENCOUNTER — Other Ambulatory Visit: Payer: Self-pay

## 2019-07-15 ENCOUNTER — Ambulatory Visit (INDEPENDENT_AMBULATORY_CARE_PROVIDER_SITE_OTHER): Payer: Managed Care, Other (non HMO) | Admitting: Cardiology

## 2019-07-15 ENCOUNTER — Encounter: Payer: Self-pay | Admitting: Cardiology

## 2019-07-15 VITALS — BP 138/98 | HR 80 | Ht 70.0 in | Wt 247.0 lb

## 2019-07-15 DIAGNOSIS — R0600 Dyspnea, unspecified: Secondary | ICD-10-CM | POA: Diagnosis not present

## 2019-07-15 DIAGNOSIS — R0609 Other forms of dyspnea: Secondary | ICD-10-CM

## 2019-07-15 DIAGNOSIS — R0683 Snoring: Secondary | ICD-10-CM

## 2019-07-15 DIAGNOSIS — R0789 Other chest pain: Secondary | ICD-10-CM

## 2019-07-15 DIAGNOSIS — I10 Essential (primary) hypertension: Secondary | ICD-10-CM

## 2019-07-15 NOTE — Addendum Note (Signed)
Addended by: Lita Mains on: 07/15/2019 04:04 PM   Modules accepted: Orders

## 2019-07-15 NOTE — Progress Notes (Signed)
Cardiology Office Note:    Date:  07/15/2019   ID:  Awilda Bill, DOB [birth year], MRN [id number]  PCP:  Angelina Sheriff, MD  Cardiologist:  Jenne Campus, MD    Referring MD: Angelina Sheriff, MD   Chief Complaint  Patient presents with  . Follow-up    History of Present Illness:    Ernest Lawson is a 44 y.o. male with atypical chest pain pain is located left side of his chest typically not related to exercise.  Years ago he got blunt trauma to his chest and he thinks that may be related to it.  Recently he end up going to the emergency because of this chest pain.  Again did happen at rest 2 troponins were negative while check in the emergency room, no acute EKG changes.  He was however noted to have high blood pressure.  Is coming today to my office because finally he would like to know where the chest pain is coming from.  We were hoping that we will be able to do stress echocardiogram with him, however, because of coronavirus situation we are not able to do it I think there is some value on doing calcium score. On top of that he snores and we already initiated conversation about potentially doing sleep study he agreed to pursue it.  Also asked him to get blood pressure monitor check his blood pressure on the regular basis and bring it to the next time when he will be here  Past Medical History:  Diagnosis Date  . Acid reflux     Past Surgical History:  Procedure Laterality Date  . FOOT SURGERY      Current Medications: Current Meds  Medication Sig  . aspirin EC 81 MG tablet Take 81 mg by mouth daily.  Marland Kitchen esomeprazole (NEXIUM) 20 MG capsule Take 20 mg by mouth daily before breakfast.  . methocarbamol (ROBAXIN) 500 MG tablet Take 1 tablet (500 mg total) by mouth 2 (two) times daily.  . metoprolol succinate (TOPROL-XL) 25 MG 24 hr tablet TAKE 1 TABLET (25 MG TOTAL) BY MOUTH DAILY. TAKE WITH OR IMMEDIATELY FOLLOWING A MEAL.  . nitroGLYCERIN (NITROSTAT) 0.4 MG SL tablet  Place 1 tablet under the tongue as needed for chest pain.     Allergies:   Chantix [varenicline tartrate]   Social History   Socioeconomic History  . Marital status: Married    Spouse name: Colletta [place]  . Number of children: Not on file  . Years of education: Not on file  . Highest education level: 11th grade  Occupational History  . Occupation: pipe fitter    CommentDesigner, jewellery con [employer]  . Smoking status: Current Some Day Smoker    Types: Cigarettes  . Smokeless tobacco: Never Used  Substance and Sexual Activity  . Alcohol use: Yes    Alcohol/week: 2.0 standard drinks    Types: 1 Cans of beer, 1 Shots of liquor per week    Comment: 1-2 beers or cocktails a week  . Drug use: No  . Sexual activity: Not on file  Other Topics Concern  . Not on file  Social History Narrative   Patient is right-handed. He is married with 2 children. He drinks 2 cups of coffee a day, and occassionally tea or soda, He is very active at work.   Social Determinants of Health   Financial Resource Strain:   . Difficulty of Paying Living Expenses: Not on file  Food Insecurity:   .  Worried About Programme researcher, broadcasting/film/video in the Last Year: Not on file  . Ran Out of Food in the Last Year: Not on file  Transportation Needs:   . Lack of Transportation (Medical): Not on file  . Lack of Transportation (Non-Medical): Not on file  Physical Activity:   . Days of Exercise per Week: Not on file  . Minutes of Exercise per Session: Not on file  Stress:   . Feeling of Stress : Not on file  Social Connections:   . Frequency of Communication with Friends and Family: Not on file  . Frequency of Social Gatherings with Friends and Family: Not on file  . Attends Religious Services: Not on file  . Active Member of Clubs or Organizations: Not on file  . Attends Banker Meetings: Not on file  . Marital Status: Not on file     Family History: The patient's family history includes High blood  pressure in his mother; Stroke in his father; Throat cancer in his mother. ROS:   Please see the history of present illness.    All 14 point review of systems negative except as described per history of present illness  EKGs/Labs/Other Studies Reviewed:      Recent Labs: No results found for requested labs within last 8760 hours.  Recent Lipid Panel No results found for: CHOL, TRIG, HDL, CHOLHDL, VLDL, LDLCALC, LDLDIRECT  Physical Exam:    VS:  BP (!) 138/98   Pulse 80   Ht 5\' 10"  (1.778 m)   Wt 247 lb (112 kg)   SpO2 97%   BMI 35.44 kg/m     Wt Readings from Last 3 Encounters:  07/15/19 247 lb (112 kg)  12/10/18 230 lb (104.3 kg)  11/09/18 230 lb (104.3 kg)     GEN:  Well nourished, well developed in no acute distress HEENT: Normal NECK: No JVD; No carotid bruits LYMPHATICS: No lymphadenopathy CARDIAC: RRR, no murmurs, no rubs, no gallops RESPIRATORY:  Clear to auscultation without rales, wheezing or rhonchi  ABDOMEN: Soft, non-tender, non-distended MUSCULOSKELETAL:  No edema; No deformity  SKIN: Warm and dry LOWER EXTREMITIES: no swelling NEUROLOGIC:  Alert and oriented x 3 PSYCHIATRIC:  Normal affect   ASSESSMENT:    1. Atypical chest pain   2. Dyspnea on exertion   3. Snoring   4. Essential hypertension    PLAN:    In order of problems listed above:  1. Atypical chest pain calcium score will be done. Dyspnea on exertion doing very well from that point review he works hard physically have to climb stairs have no difficulty doing it 3.  Snoring sleep study will be scheduled For.  Essential hypertension ask him to get blood pressure monitor and check it on the regular basis   Medication Adjustments/Labs and Tests Ordered: Current medicines are reviewed at length with the patient today.  Concerns regarding medicines are outlined above.  No orders of the defined types were placed in this encounter.  Medication changes: No orders of the defined types were  placed in this encounter.   Signed, 01/09/19, MD, [facility] 07/15/2019 3:57 PM    [facility]

## 2019-07-15 NOTE — Patient Instructions (Signed)
Medication Instructions:  Your physician recommends that you continue on your current medications as directed. Please refer to the Current Medication list given to you today.  *If you need a refill on your cardiac medications before your next appointment, please call your pharmacy*  Lab Work: None.  If you have labs (blood work) drawn today and your tests are completely normal, you will receive your results only by: Marland Kitchen MyChart Message (if you have MyChart) OR . A paper copy in the mail If you have any lab test that is abnormal or we need to change your treatment, we will call you to review the results.  Testing/Procedures: Non-Cardiac CT scanning, (CAT scanning), is a noninvasive, special x-ray that produces cross-sectional images of the body using x-rays and a computer. CT scans help physicians diagnose and treat medical conditions. For some CT exams, a contrast material is used to enhance visibility in the area of the body being studied. CT scans provide greater clarity and reveal more details than regular x-ray exams.  Your physician has recommended that you have a sleep study. This test records several body functions during sleep, including: brain activity, eye movement, oxygen and carbon dioxide blood levels, heart rate and rhythm, breathing rate and rhythm, the flow of air through your mouth and nose, snoring, body muscle movements, and chest and belly movement.    Follow-Up: At [facility], you and your health needs are our priority.  As part of our continuing mission to provide you with exceptional heart care, we have created designated Provider Care Teams.  These Care Teams include your primary Cardiologist (physician) and Advanced Practice Providers (APPs -  Physician Assistants and Nurse Practitioners) who all work together to provide you with the care you need, when you need it.  Your next appointment:   6 week(s)  The format for your next appointment:   In  Person  Provider:   Jenne Campus, MD  Other Instructions   Coronary Calcium Scan A coronary calcium scan is an imaging test used to look for deposits of plaque in the inner lining of the blood vessels of the heart (coronary arteries). Plaque is made up of calcium, protein, and fatty substances. These deposits of plaque can partly clog and narrow the coronary arteries without producing any symptoms or warning signs. This puts a person at risk for a heart attack. This test is recommended for people who are at moderate risk for heart disease. The test can find plaque deposits before symptoms develop. Tell a health care provider about:  Any allergies you have.  All medicines you are taking, including vitamins, herbs, eye drops, creams, and over-the-counter medicines.  Any problems you or family members have had with anesthetic medicines.  Any blood disorders you have.  Any surgeries you have had.  Any medical conditions you have.  Whether you are pregnant or may be pregnant. What are the risks? Generally, this is a safe procedure. However, problems may occur, including:  Harm to a pregnant woman and her unborn baby. This test involves the use of radiation. Radiation exposure can be dangerous to a pregnant woman and her unborn baby. If you are pregnant or think you may be pregnant, you should not have this procedure done.  Slight increase in the risk of cancer. This is because of the radiation involved in the test. What happens before the procedure? Ask your health care provider for any specific instructions on how to prepare for this procedure. You may be asked to  avoid products that contain caffeine, tobacco, or nicotine for 4 hours before the procedure. What happens during the procedure?   You will undress and remove any jewelry from your neck or chest.  You will put on a hospital gown.  Sticky electrodes will be placed on your chest. The electrodes will be connected to an  electrocardiogram (ECG) machine to record a tracing of the electrical activity of your heart.  You will lie down on a curved bed that is attached to the CT scanner.  You may be given medicine to slow down your heart rate so that clear pictures can be created.  You will be moved into the CT scanner, and the CT scanner will take pictures of your heart. During this time, you will be asked to lie still and hold your breath for 2-3 seconds at a time while each picture of your heart is being taken. The procedure may vary among health care providers and hospitals. What happens after the procedure?  You can get dressed.  You can return to your normal activities.  It is up to you to get the results of your procedure. Ask your health care provider, or the department that is doing the procedure, when your results will be ready. Summary  A coronary calcium scan is an imaging test used to look for deposits of plaque in the inner lining of the blood vessels of the heart (coronary arteries). Plaque is made up of calcium, protein, and fatty substances.  Generally, this is a safe procedure. Tell your health care provider if you are pregnant or may be pregnant.  Ask your health care provider for any specific instructions on how to prepare for this procedure.  A CT scanner will take pictures of your heart.  You can return to your normal activities after the scan is done. This information is not intended to replace advice given to you by your health care provider. Make sure you discuss any questions you have with your health care provider. Document Revised: 01/05/2019 Document Reviewed: 01/05/2019 Elsevier Patient Education  2020 Elsevier Inc.    Sleep Studies A sleep study (polysomnogram) is a series of tests done while you are sleeping. A sleep study records your brain waves, heart rate, breathing rate, oxygen level, and eye and leg movements. A sleep study helps your health care provider:  See  how well you sleep.  Diagnose a sleep disorder.  Determine how severe your sleep disorder is.  Create a plan to treat your sleep disorder. Your health care provider may recommend a sleep study if you:  Feel sleepy on most days.  Snore loudly while sleeping.  Have unusual behaviors while you sleep, such as walking.  Have brief periods in which you stop breathing during sleep (sleepapnea).  Fall asleep suddenly during the day (narcolepsy).  Have trouble falling asleep or staying asleep (insomnia).  Feel like you need to move your legs when trying to fall asleep (restless legs syndrome).  Move your legs by flexing and extending them regularly while asleep (periodic limb movement disorder).  Act out your dreams while you sleep (sleep behavior disorder).  Feel like you cannot move when you first wake up (sleep paralysis). What tests are part of a sleep study? Most sleep studies record the following during sleep:  Brain activity.  Eye movements.  Heart rate and rhythm.  Breathing rate and rhythm.  Blood-oxygen level.  Blood pressure.  Chest and belly movement as you breathe.  Arm and leg movements.  Snoring or other noises.  Body position. Where are sleep studies done? Sleep studies are done at sleep centers. A sleep center may be inside a hospital, office, or clinic. The room where you have the study may look like a hospital room or a hotel room. The health care providers doing the study may come in and out of the room during the study. Most of the time, they will be in another room monitoring your test as you sleep. How are sleep studies done? Most sleep studies are done during a normal period of time for a full night of sleep. You will arrive at the study center in the evening and go home in the morning. Before the test  Bring your pajamas and toothbrush with you to the sleep study.  Do not have caffeine on the day of your sleep study.  Do not drink alcohol  on the day of your sleep study.  Your health care provider will let you know if you should stop taking any of your regular medicines before the test. During the test      Round, sticky patches with sensors attached to recording wires (electrodes) are placed on your scalp, face, chest, and limbs.  Wires from all the electrodes and sensors run from your bed to a computer. The wires can be taken off and put back on if you need to get out of bed to go to the bathroom.  A sensor is placed over your nose to measure airflow.  A finger clip is put on your finger or ear to measure your blood oxygen level (pulse oximetry).  A belt is placed around your belly and a belt is placed around your chest to measure breathing movements.  If you have signs of the sleep disorder called sleep apnea during your test, you may get a treatment mask to wear for the second half of the night. ? The mask provides positive airway pressure (PAP) to help you breathe better during sleep. This may greatly improve your sleep apnea. ? You will then have all tests done again with the mask in place to see if your measurements and recordings change. After the test  A medical doctor who specializes in sleep will evaluate the results of your sleep study and share them with you and your primary health care provider.  Based on your results, your medical history, and a physical exam, you may be diagnosed with a sleep disorder, such as: ? Sleep apnea. ? Restless legs syndrome. ? Sleep-related behavior disorder. ? Sleep-related movement disorders. ? Sleep-related seizure disorders.  Your health care team will help determine your treatment options based on your diagnosis. This may include: ? Improving your sleep habits (sleep hygiene). ? Wearing a continuous positive airway pressure (CPAP) or bi-level positive airway pressure (BPAP) mask. ? Wearing an oral device at night to improve breathing and reduce snoring. ? Taking  medicines. Follow these instructions at home:  Take over-the-counter and prescription medicines only as told by your health care provider.  If you are instructed to use a CPAP or BPAP mask, make sure you use it nightly as directed.  Make any lifestyle changes that your health care provider recommends.  If you were given a device to open your airway while you sleep, use it only as told by your health care provider.  Do not use any tobacco products, such as cigarettes, chewing tobacco, and e-cigarettes. If you need help quitting, ask your health care provider.  Keep all follow-up  visits as told by your health care provider. This is important. Summary  A sleep study (polysomnogram) is a series of tests done while you are sleeping. It shows how well you sleep.  Most sleep studies are done over one full night of sleep. You will arrive at the study center in the evening and go home in the morning.  If you have signs of the sleep disorder called sleep apnea during your test, you may get a treatment mask to wear for the second half of the night.  A medical doctor who specializes in sleep will evaluate the results of your sleep study and share them with your primary health care provider. This information is not intended to replace advice given to you by your health care provider. Make sure you discuss any questions you have with your health care provider. Document Revised: 12/02/2018 Document Reviewed: 07/15/2017 Elsevier Patient Education  2020 [employer].

## 2019-08-02 ENCOUNTER — Ambulatory Visit (INDEPENDENT_AMBULATORY_CARE_PROVIDER_SITE_OTHER)
Admission: RE | Admit: 2019-08-02 | Discharge: 2019-08-02 | Disposition: A | Discharge status: Home / Self Care | Payer: Self-pay | Source: Ambulatory Visit | Attending: Cardiology | Admitting: Cardiology

## 2019-08-02 ENCOUNTER — Other Ambulatory Visit: Payer: Self-pay

## 2019-08-02 ENCOUNTER — Telehealth: Payer: Self-pay | Admitting: *Deleted

## 2019-08-02 DIAGNOSIS — R0600 Dyspnea, unspecified: Secondary | ICD-10-CM

## 2019-08-02 DIAGNOSIS — R0789 Other chest pain: Secondary | ICD-10-CM

## 2019-08-02 NOTE — Telephone Encounter (Signed)
Faxed PA request for in lab split night sleep study.

## 2019-08-13 ENCOUNTER — Other Ambulatory Visit: Payer: Self-pay | Admitting: *Deleted

## 2019-08-13 ENCOUNTER — Other Ambulatory Visit: Payer: Self-pay | Admitting: Cardiology

## 2019-08-13 MED ORDER — METOPROLOL SUCCINATE ER 25 MG PO TB24: 25.0000 mg | ORAL_TABLET | Freq: Every day | ORAL | 0 refills | Status: DC

## 2019-08-13 NOTE — Telephone Encounter (Signed)
Refill sent.

## 2019-08-13 NOTE — Telephone Encounter (Signed)
*  STAT* If patient is at the pharmacy, call can be transferred to refill team.   1. Which medications need to be refilled? (please list name of each medication and dose if known) metoprolol succinate (TOPROL-XL) 25 MG 24 hr tablet  2. Which pharmacy/location (including street and city if local pharmacy) is medication to be sent to? [id number] - [place], [place] - [address]  3. Do they need a 30 day or 90 day supply? 90  

## 2019-08-16 ENCOUNTER — Telehealth: Payer: Self-pay | Admitting: *Deleted

## 2019-08-16 NOTE — Telephone Encounter (Signed)
Called [facility] to check the status of the sleep study PA request. Spoke with [facility] and was informed the request could not be located. After minutes of looking she states the request was faxed to the wrong number. Although it was sent to the fax # on their form. She apologized stating that it it "totally our fault that the #on the form is incorrect." She then gave me a different # to send the request to. The request was refaxed to [phone]. It was requested that it be processed ASAP due to the length of time we have been waiting on a decision due to their error.

## 2019-08-20 ENCOUNTER — Ambulatory Visit: Payer: Managed Care, Other (non HMO) | Admitting: Cardiology

## 2019-09-07 ENCOUNTER — Telehealth: Payer: Self-pay | Admitting: *Deleted

## 2019-09-07 ENCOUNTER — Ambulatory Visit: Payer: Managed Care, Other (non HMO) | Admitting: Cardiology

## 2019-09-07 NOTE — Telephone Encounter (Signed)
Staff message sent to [facility] office ok to schedule sleep study. [facility] auth received. Auth # [id number]. Valid dates 08/31/19 to 12/01/19.

## 2019-09-09 ENCOUNTER — Telehealth: Payer: Self-pay | Admitting: *Deleted

## 2019-09-09 NOTE — Telephone Encounter (Signed)
Patient notified of sleep study and COVID appointment details. 

## 2019-09-27 ENCOUNTER — Other Ambulatory Visit (HOSPITAL_COMMUNITY)
Admission: RE | Admit: 2019-09-27 | Discharge: 2019-09-27 | Disposition: A | Discharge status: Home / Self Care | Payer: Managed Care, Other (non HMO) | Source: Ambulatory Visit | Attending: Cardiovascular Disease | Admitting: Cardiovascular Disease

## 2019-09-27 DIAGNOSIS — Z20822 Contact with and (suspected) exposure to covid-19: Secondary | ICD-10-CM | POA: Insufficient documentation

## 2019-09-27 DIAGNOSIS — Z01812 Encounter for preprocedural laboratory examination: Secondary | ICD-10-CM | POA: Diagnosis not present

## 2019-09-27 LAB — SARS CORONAVIRUS 2 (TAT 6-24 HRS): SARS Coronavirus 2: NEGATIVE

## 2019-09-30 ENCOUNTER — Other Ambulatory Visit: Payer: Self-pay

## 2019-09-30 ENCOUNTER — Ambulatory Visit (HOSPITAL_BASED_OUTPATIENT_CLINIC_OR_DEPARTMENT_OTHER): Payer: Managed Care, Other (non HMO) | Attending: Cardiology | Admitting: Cardiovascular Disease

## 2019-09-30 DIAGNOSIS — Z7901 Long term (current) use of anticoagulants: Secondary | ICD-10-CM | POA: Insufficient documentation

## 2019-09-30 DIAGNOSIS — G4733 Obstructive sleep apnea (adult) (pediatric): Secondary | ICD-10-CM | POA: Diagnosis not present

## 2019-09-30 DIAGNOSIS — R0683 Snoring: Secondary | ICD-10-CM | POA: Diagnosis not present

## 2019-09-30 DIAGNOSIS — Z7982 Long term (current) use of aspirin: Secondary | ICD-10-CM | POA: Diagnosis not present

## 2019-09-30 DIAGNOSIS — Z79899 Other long term (current) drug therapy: Secondary | ICD-10-CM | POA: Diagnosis not present

## 2019-10-05 ENCOUNTER — Ambulatory Visit: Payer: Managed Care, Other (non HMO) | Admitting: Cardiology

## 2019-10-09 ENCOUNTER — Encounter (HOSPITAL_BASED_OUTPATIENT_CLINIC_OR_DEPARTMENT_OTHER): Payer: Self-pay | Admitting: Cardiovascular Disease

## 2019-10-09 NOTE — Procedures (Signed)
Patient Name: Ernest Lawson, Ernest Lawson Date: 09/30/2019 Gender: Male D.O.B: 08/02/1975 Age (years): 44 Referring Provider: Park Liter Height (inches): 80 Interpreting Physician: Shelva Majestic MD, ABSM Weight (lbs): 230 RPSGT: Laren Everts BMI: 33 MRN: [id number] Neck Size: 17.50  CLINICAL INFORMATION Sleep Study Type: Split Night CPAP  Indication for sleep study: Fatigue, Hypertension, Obesity, Snoring  Epworth Sleepiness Score: 4  SLEEP STUDY TECHNIQUE As per the AASM Manual for the Scoring of Sleep and Associated Events v2.3 (April 2016) with a hypopnea requiring 4% desaturations.  The channels recorded and monitored were frontal, central and occipital EEG, electrooculogram (EOG), submentalis EMG (chin), nasal and oral airflow, thoracic and abdominal wall motion, anterior tibialis EMG, snore microphone, electrocardiogram, and pulse oximetry. Continuous positive airway pressure (CPAP) was initiated when the patient met split night criteria and was titrated according to treat sleep-disordered breathing.  MEDICATIONS aspirin EC 81 MG tablet esomeprazole (NEXIUM) 20 MG capsule methocarbamol (ROBAXIN) 500 MG tablet metoprolol succinate (TOPROL-XL) 25 MG 24 hr tablet nitroGLYCERIN (NITROSTAT) 0.4 MG SL tablet Medications self-administered by patient taken the night of the study : N/A  RESPIRATORY PARAMETERS Diagnostic Total AHI (/hr): 20.0 RDI (/hr): 44.0 OA Index (/hr): 0.5 CA Index (/hr): 0.0 REM AHI (/hr): 40.0 NREM AHI (/hr): 18.9 Supine AHI (/hr): 20.6 Non-supine AHI (/hr): 19.6 Min O2 Sat (%): 86.0 Mean O2 (%): 96.5 Time below 88% (min): 0.8   Titration Optimal Pressure (cm): 12 AHI at Optimal Pressure (/hr): 3.1 Min O2 at Optimal Pressure (%): 95.0 Supine % at Optimal (%): 100 Sleep % at Optimal (%): 95  SLEEP ARCHITECTURE The recording time for the entire night was 370 minutes.  During a baseline period of 189.8 minutes, the patient slept for 120.0  minutes in REM and nonREM, yielding a sleep efficiency of 63.2%%. Sleep onset after lights out was 20.4 minutes with a REM latency of 159.5 minutes. The patient spent 31.3%% of the night in stage N1 sleep, 63.8%% in stage N2 sleep, 0.0%% in stage N3 and 5% in REM.  During the titration period of 176.1 minutes, the patient slept for 134.5 minutes in REM and nonREM, yielding a sleep efficiency of 76.4%%. Sleep onset after CPAP initiation was 9.5 minutes with a REM latency of 60.0 minutes. The patient spent 27.1%% of the night in stage N1 sleep, 34.9%% in stage N2 sleep, 0.0%% in stage N3 and 37.9% in REM.  CARDIAC DATA The 2 lead EKG demonstrated sinus rhythm. The mean heart rate was 100.0 beats per minute. Other EKG findings include: None.  LEG MOVEMENT DATA The total Periodic Limb Movements of Sleep (PLMS) were 0. The PLMS index was 0.0 .  IMPRESSIONS - Moderate obstructive sleep apnea overall during the diagnostic portion of the study (AHI  20.0/h; RDI 44.0); however, events were severe during REM sleep (AHI 40.0/h). CPAP was initiated at 5 cm and was titrated to optimal PAP pressure at 12 cm of water: AHI 3.1/h with one central event; O2 nadir 95%. - No significant central sleep apnea occurred during the diagnostic portion of the study (CAI = 0.0/hour). - Mild oxygen desaturation during the diagnostic portion of the study to a nadir of 86.0%. - The patient snored with moderate snoring volume during the diagnostic portion of the study.  - No cardiac abnormalities were noted during this study. - Clinically significant periodic limb movements did not occur during sleep.  DIAGNOSIS - Obstructive Sleep Apnea (327.23 [G47.33 ICD-10])  RECOMMENDATIONS - Recommend an initial trial of CPAP  therapy with EPR at 12 cm H2O with heated humidification. A Medium size Resmed Full Face Mask AirFit F20 mask was used for the titration. - Effort should be made to optimize nasal and oropharyngeal patency.  -  Avoid alcohol, sedatives and other CNS depressants that may worsen sleep apnea and disrupt normal sleep architecture. - Sleep hygiene should be reviewed to assess factors that may improve sleep quality. - Weight management (BMI 33) and regular exercise should be initiated or continued. - Recommend a download in 30 days and sleep Center evaluation after 4 weeks of therapy.  [Electronically signed] 10/09/2019 09:35 AM  Shelva Majestic MD, Tomasa Hose Diplomate, American Board of Sleep Medicine   NPI: [id number] [place] PH: [phone]   FX: [phone] [place]

## 2019-10-21 ENCOUNTER — Telehealth: Payer: Self-pay | Admitting: *Deleted

## 2019-10-21 DIAGNOSIS — I10 Essential (primary) hypertension: Secondary | ICD-10-CM

## 2019-10-21 DIAGNOSIS — G4733 Obstructive sleep apnea (adult) (pediatric): Secondary | ICD-10-CM

## 2019-10-21 DIAGNOSIS — R0683 Snoring: Secondary | ICD-10-CM

## 2019-10-21 NOTE — Telephone Encounter (Signed)
-----   Message from [name], MD sent at 10/09/2019  9:41 AM EDT ----- Ernest Lawson, please notify pt of results and set up with DME for CPAP initiation

## 2019-10-21 NOTE — Telephone Encounter (Signed)
Informed patient of sleep study results and patient understanding was verbalized. Patient understands his sleep study showed Moderate obstructive sleep apnea overall during the diagnostic portion of the study (AHI  20.0/h; Recommend an initial trial of CPAP therapy with EPR at 12 cm H2O with heated humidification. A Medium size Resmed Full Face Mask AirFit F20 mask was used for the titration.  Upon patient request DME selection is [facility]. Patient understands he will be contacted by  [facility] to set up his cpap. Patient understands to call if [facility] does not contact him with new setup in a timely manner. Patient understands they will be called once confirmation has been received from [facility] that they have received their new machine to schedule 10 week follow up appointment.  [facility] notified of new cpap order  Please add to airview Patient was grateful for the call and thanked me.

## 2019-11-04 ENCOUNTER — Other Ambulatory Visit: Payer: Self-pay | Admitting: Cardiology

## 2019-11-09 NOTE — Telephone Encounter (Signed)
Orders for sleep equipment signed on 11/09/2019 and faxed back to [facility];

## 2020-02-08 ENCOUNTER — Other Ambulatory Visit: Payer: Self-pay

## 2020-02-08 ENCOUNTER — Ambulatory Visit (INDEPENDENT_AMBULATORY_CARE_PROVIDER_SITE_OTHER): Payer: No Typology Code available for payment source | Admitting: Cardiovascular Disease

## 2020-02-08 ENCOUNTER — Encounter: Payer: Self-pay | Admitting: Cardiovascular Disease

## 2020-02-08 DIAGNOSIS — R0683 Snoring: Secondary | ICD-10-CM

## 2020-02-08 DIAGNOSIS — G4733 Obstructive sleep apnea (adult) (pediatric): Secondary | ICD-10-CM

## 2020-02-08 DIAGNOSIS — I10 Essential (primary) hypertension: Secondary | ICD-10-CM

## 2020-02-08 DIAGNOSIS — R0789 Other chest pain: Secondary | ICD-10-CM

## 2020-02-08 NOTE — Telephone Encounter (Signed)
Patient has a 10 week follow up appointment scheduled for 02/08/20. Patient understands he needs to keep this appointment for insurance compliance. Patient was grateful for the call and thanked me.   RE: FOLLOW UP VISIT w/DR Cathie Beams, Artist Pais, CMA Patient has been scheduled

## 2020-02-08 NOTE — Patient Instructions (Signed)
Medication Instructions:  CONTINUE WITH CURRENT MEDICATIONS. NO CHANGES.  *If you need a refill on your cardiac medications before your next appointment, please call your pharmacy*  Follow-Up: FOLLOW UP AS NEEDED WITH DR.KELLY   Other Instructions RESMED N30i MASK

## 2020-02-08 NOTE — Progress Notes (Signed)
Cardiology Office Note    Date:  02/09/2020   ID:  Ernest Lawson, DOB February 20, 1976, MRN [id number]  PCP:  Angelina Sheriff, MD  Cardiologist:  Shelva Majestic, MD (sleep): Dr.Krasowski  New sleep evaluaiton  History of Present Illness:  Ernest Lawson is a 44 y.o. male who was added onto my schedule today and is seen for initial sleep evaluation following initiation of CPAP therapy.  Ernest Lawson is a patient of Dr. Jenne Campus.  He was evaluated by Dr. Agustin Cree for atypical chest pain felt not to be of cardiac etiology.  The patient admitted to a history of significant snoring, fatigue, and had symptoms highly suggestive of obstructive sleep apnea.  He was referred for a sleep study which was done on September 30, 2019 and he met split-night criteria.  He was found to have moderate overall sleep apnea with a AHI of 20.0, RDI of 44.0, and an REM sleep, sleep apnea was severe with a AHI of 40.  He had oxygen desaturation to a nadir of 86%.  CPAP was titrated up to 12 cm water pressure with excellent response and AHI of 3.1/h and 1 central event with O2 nadir at 95%.  He believes his CPAP set up date was Nov 19, 2019 with [facility] as his DME company.  Since initiating CPAP, he does feel significantly improved with reference to sleep.  He is no longer snoring.  His sleep is now restorative.  Previous nocturia has improved.  He denies any residual daytime sleepiness.  They realized today that they needed to be seen in a face-to-face evaluation before next week to call the office and I worked him into see him today for evaluation. He is on metoprolol succinate 25 mg daily for hypertension.  He is on Nexium for GERD.  Past Medical History:  Diagnosis Date  . Acid reflux     Past Surgical History:  Procedure Laterality Date  . FOOT SURGERY      Current Medications: Outpatient Medications Prior to Visit  Medication Sig Dispense Refill  . aspirin EC 81 MG tablet Take 81 mg by  mouth daily.    Ernest Kitchen esomeprazole (NEXIUM) 20 MG capsule Take 20 mg by mouth daily before breakfast.    . metoprolol succinate (TOPROL-XL) 25 MG 24 hr tablet Take 1 tablet (25 mg total) by mouth daily. Take with or immediately following a meal. 90 tablet 1  . nitroGLYCERIN (NITROSTAT) 0.4 MG SL tablet Place 1 tablet under the tongue as needed for chest pain.    . methocarbamol (ROBAXIN) 500 MG tablet Take 1 tablet (500 mg total) by mouth 2 (two) times daily. 20 tablet 0   No facility-administered medications prior to visit.     Allergies:   Chantix [varenicline tartrate]   Social History   Socioeconomic History  . Marital status: Married    Spouse name: Colletta [place]  . Number of children: Not on file  . Years of education: Not on file  . Highest education level: 11th grade  Occupational History  . Occupation: pipe fitter    CommentDesigner, jewellery con [employer]  . Smoking status: Current Some Day Smoker    Types: Cigarettes  . Smokeless tobacco: Never Used  Substance and Sexual Activity  . Alcohol use: Yes    Alcohol/week: 2.0 standard drinks    Types: 1 Cans of beer, 1 Shots of liquor per week    Comment: 1-2 beers or cocktails a week  .  Drug use: No  . Sexual activity: Not on file  Other Topics Concern  . Not on file  Social History Narrative   Patient is right-handed. He is married with 2 children. He drinks 2 cups of coffee a day, and occassionally tea or soda, He is very active at work.   Social Determinants of Health   Financial Resource Strain:   . Difficulty of Paying Living Expenses:   Food Insecurity:   . Worried About Charity fundraiser in the Last Year:   . Arboriculturist in the Last Year:   Transportation Needs:   . Film/video editor (Medical):   Ernest Kitchen Lack of Transportation (Non-Medical):   Physical Activity:   . Days of Exercise per Week:   . Minutes of Exercise per Session:   Stress:   . Feeling of Stress :   Social Connections:   . Frequency of  Communication with Friends and Family:   . Frequency of Social Gatherings with Friends and Family:   . Attends Religious Services:   . Active Member of Clubs or Organizations:   . Attends Archivist Meetings:   Ernest Kitchen Marital Status:     He is married for 7 years.  He has 2 children ages 91 and 56.  He works as a Scientist, product/process development.  He smokes 1/2 pack of cigarettes per day.  He does drink beer.  Family History:  The patient's family history includes High blood pressure in his mother; Stroke in his father; Throat cancer in his mother.  His mother is living at age 85.  Father is deceased.  ROS General: Negative; No fevers, chills, or night sweats;  HEENT: Negative; No changes in vision or hearing, sinus congestion, difficulty swallowing Pulmonary: Negative; No cough, wheezing, shortness of breath, hemoptysis Cardiovascular: Recent atypical chest pain and mild hypertension GI: Negative; No nausea, vomiting, diarrhea, or abdominal pain GU: Negative; No dysuria, hematuria, or difficulty voiding Musculoskeletal: Negative; no myalgias, joint pain, or weakness Hematologic/Oncology: Negative; no easy bruising, bleeding Endocrine: Negative; no heat/cold intolerance; no diabetes Neuro: Negative; no changes in balance, headaches Skin: Negative; No rashes or skin lesions Psychiatric: Negative; No behavioral problems, depression Sleep: Positive for OSA, CPAP initiated Nov 19, 2019 with resolution of prior snoring, nonrestorative sleep, and significant improvement in daytime sleepiness. No bruxism, restless legs, hypnogognic hallucinations, no cataplexy Other comprehensive 14 point system review is negative.   PHYSICAL EXAM:   VS:  BP (!) 146/82   Pulse 78   Ht 5' 10" (1.778 m)   Wt 248 lb 12.8 oz (112.9 kg)   BMI 35.70 kg/m     Repeat blood pressure by me was 140/82  Wt Readings from Last 3 Encounters:  02/08/20 248 lb 12.8 oz (112.9 kg)  09/30/19 230 lb (104.3 kg)  07/15/19 247 lb (112 kg)      General: Alert, oriented, no distress.  Skin: normal turgor, no rashes, warm and dry HEENT: Normocephalic, atraumatic. Pupils equal round and reactive to light; sclera anicteric; extraocular muscles intact;  Nose without nasal septal hypertrophy Mouth/Parynx benign; Mallinpatti scale 3 Neck: No JVD, no carotid bruits; normal carotid upstroke Lungs: clear to ausculatation and percussion; no wheezing or rales Chest wall: without tenderness to palpitation Heart: PMI not displaced, RRR, s1 s2 normal, 1/6 systolic murmur, no diastolic murmur, no rubs, gallops, thrills, or heaves Abdomen: soft, nontender; no hepatosplenomehaly, BS+; abdominal aorta nontender and not dilated by palpation. Back: no CVA tenderness Pulses 2+ Musculoskeletal: full range  of motion, normal strength, no joint deformities Extremities: no clubbing cyanosis or edema, Homan's sign negative  Neurologic: grossly nonfocal; Cranial nerves grossly wnl Psychologic: Normal mood and affect   Studies/Labs Reviewed:   EKG:  EKG is ordered today.  ECG (independently read by me): NSR at 76; no ectopy; normal intervals  Recent Labs: BMP Latest Ref Rng & Units 05/07/2017  Glucose 65 - 99 mg/dL 104(H)  BUN 6 - 20 mg/dL 12  Creatinine 0.61 - 1.24 mg/dL 0.80  Sodium 135 - 145 mmol/L 136  Potassium 3.5 - 5.1 mmol/L 4.0  Chloride 101 - 111 mmol/L 107  CO2 22 - 32 mmol/L 21(L)  Calcium 8.9 - 10.3 mg/dL 9.6     No flowsheet data found.  CBC Latest Ref Rng & Units 05/07/2017  WBC 4.0 - 10.5 K/uL 6.3  Hemoglobin 13.0 - 17.0 g/dL 14.3  Hematocrit 39 - 52 % 42.6  Platelets 150 - 400 K/uL 179   Lab Results  Component Value Date   MCV 88.0 05/07/2017   No results found for: TSH No results found for: HGBA1C   BNP No results found for: BNP  ProBNP No results found for: PROBNP   Lipid Panel  No results found for: CHOL, TRIG, HDL, CHOLHDL, VLDL, LDLCALC, LDLDIRECT, LABVLDL   RADIOLOGY: No results  found.   Additional studies/ records that were reviewed today include:   SPLIT NIGHT PROTOCOL: 09/30/2019 RESPIRATORY PARAMETERS Diagnostic Total AHI (/hr):            20.0     RDI (/hr):         44.0     OA Index (/hr):            0.5       CA Index (/hr):      0.0 REM AHI (/hr):            40.0     NREM AHI (/hr):          18.9     Supine AHI (/hr):         20.6     Non-supine AHI (/hr):        19.6 Min O2 Sat (%):          86.0     Mean O2 (%):  96.5     Time below 88% (min):           0.8         Titration Optimal Pressure (cm):           12        AHI at Optimal Pressure (/hr):            3.1       Min O2 at Optimal Pressure (%):       95.0 Supine % at Optimal (%):       100      Sleep % at Optimal (%):         95         SLEEP ARCHITECTURE The recording time for the entire night was 370 minutes.  During a baseline period of 189.8 minutes, the patient slept for 120.0 minutes in REM and nonREM, yielding a sleep efficiency of 63.2%%. Sleep onset after lights out was 20.4 minutes with a REM latency of 159.5 minutes. The patient spent 31.3%% of the night in stage N1 sleep, 63.8%% in stage N2 sleep, 0.0%% in stage N3 and 5% in REM.  During the titration  period of 176.1 minutes, the patient slept for 134.5 minutes in REM and nonREM, yielding a sleep efficiency of 76.4%%. Sleep onset after CPAP initiation was 9.5 minutes with a REM latency of 60.0 minutes. The patient spent 27.1%% of the night in stage N1 sleep, 34.9%% in stage N2 sleep, 0.0%% in stage N3 and 37.9% in REM.  CARDIAC DATA The 2 lead EKG demonstrated sinus rhythm. The mean heart rate was 100.0 beats per minute. Other EKG findings include: None.  LEG MOVEMENT DATA The total Periodic Limb Movements of Sleep (PLMS) were 0. The PLMS index was 0.0 .  IMPRESSIONS - Moderate obstructive sleep apnea overall during the diagnostic portion of the study (AHI  20.0/h; RDI 44.0); however, events were severe during REM sleep (AHI  40.0/h). CPAP was initiated at 5 cm and was titrated to optimal PAP pressure at 12 cm of water: AHI 3.1/h with one central event; O2 nadir 95%. - No significant central sleep apnea occurred during the diagnostic portion of the study (CAI = 0.0/hour). - Mild oxygen desaturation during the diagnostic portion of the study to a nadir of 86.0%. - The patient snored with moderate snoring volume during the diagnostic portion of the study.  - No cardiac abnormalities were noted during this study. - Clinically significant periodic limb movements did not occur during sleep.  DIAGNOSIS - Obstructive Sleep Apnea (327.23 [G47.33 ICD-10])  RECOMMENDATIONS - Recommend an initial trial of CPAP therapy with EPR at 12 cm H2O with heated humidification. A Medium size Resmed Full Face Mask AirFit F20 mask was used for the titration. - Effort should be made to optimize nasal and oropharyngeal patency.  - Avoid alcohol, sedatives and other CNS depressants that may worsen sleep apnea and disrupt normal sleep architecture. - Sleep hygiene should be reviewed to assess factors that may improve sleep quality. - Weight management (BMI 33) and regular exercise should be initiated or continued. - Recommend a download in 30 days and sleep Center evaluation after 4 weeks of therapy.   ASSESSMENT:    1. OSA (obstructive sleep apnea)   2. Essential hypertension   3. Snoring   4. Atypical chest pain     PLAN:  Ernest Lawson is a 44 year old gentleman who had recently seen Dr. Agustin Cree for atypical chest pain not felt to be of cardiac etiology.  He has a history of mild hypertension.  Due to complaints of significant fatigability, nonrestorative sleep, snoring, he was referred for a sleep study which confirmed moderate overall sleep apnea; however sleep apnea was severe during REM sleep.  He has been on CPAP therapy since Nov 19, 2019.  A download obtained in the office today confirms 100% compliance.  The 1 day over the  past month that he did not use the device was when he was on an outdoor camping trip in a tent and could not use it that evening.  He is averaging approximately 8 hours of CPAP use per night.  At a 12 cm set pressure, AHI is excellent at 0.7.  Certainly, he has been using an N 20 mask.  At times there has been some irritation.  I have suggested a trial of the new ResMed air fit N30i mask and will send a prescription recommendation to [facility].  I discussed with him at length his sleep study and discussed the portance of continued therapy as well as adverse consequences of untreated sleep apnea particularly with reference to his cardiovascular health.  BMI is increased consistent  with moderate obesity.  We discussed the importance of weight loss and exercise.  He is meeting compliance standards.  I will be available in the future as problems arise from a sleep perspective.  He returned to the primary cardiology care of Dr. Agustin Cree.   Medication Adjustments/Labs and Tests Ordered: Current medicines are reviewed at length with the patient today.  Concerns regarding medicines are outlined above.  Medication changes, Labs and Tests ordered today are listed in the Patient Instructions below. Patient Instructions  Medication Instructions:  CONTINUE WITH CURRENT MEDICATIONS. NO CHANGES.  *If you need a refill on your cardiac medications before your next appointment, please call your pharmacy*  Follow-Up: FOLLOW UP AS NEEDED WITH DR.KELLY   Other Instructions RESMED N30i MASK     Signed, Shelva Majestic, MD  02/09/2020 3:47 PM    [facility] [address], [address], [place], [place]  [address] Phone: [phone]

## 2020-02-09 ENCOUNTER — Encounter: Payer: Self-pay | Admitting: Cardiovascular Disease

## 2020-02-09 ENCOUNTER — Telehealth: Payer: Self-pay | Admitting: *Deleted

## 2020-02-09 NOTE — Telephone Encounter (Signed)
Order faxed to [facility] in [place] for ResMed Airfit N30i mask.fax # [phone]. Phone # [phone].

## 2020-07-14 ENCOUNTER — Other Ambulatory Visit: Payer: Self-pay | Admitting: Cardiology

## 2020-07-14 NOTE — Telephone Encounter (Signed)
Rx refill sent to pharmacy. 

## 2020-08-13 ENCOUNTER — Other Ambulatory Visit: Payer: Self-pay | Admitting: Cardiology

## 2020-08-14 NOTE — Telephone Encounter (Signed)
Rx refill sent to pharmacy. 

## 2020-08-29 IMAGING — CT CT HEART SCORING
3 series · 14 of 20 positions shown, 15 images · non-contrast
Comparison: None.
COMPARISON: None.

Addendum:
EXAM:
OVER-READ INTERPRETATION  CT CHEST

The following report is an over-read performed by radiologist Dr.
Deivy Facundo [REDACTED] on 08/02/2019. This over-read
does not include interpretation of cardiac or coronary anatomy or
pathology. The coronary calcium score interpretation by the
cardiologist is attached.
CLINICAL DATA: Risk stratification
Coronary Calcium Score
TECHNIQUE: The patient was scanned on a Siemens Force scanner. Axial
non-contrast 3 mm slices were carried out through the heart. The
data set was analyzed on a dedicated work station and scored using
the Agatson method.

[Series 2: casc 3.0 bv41 2 bestdiast 69 % · axial · 0.39mm/px · z∈[-243,-162]mm · 4 of 46 slices shown, 5 images]
[im 10/46  vessel]
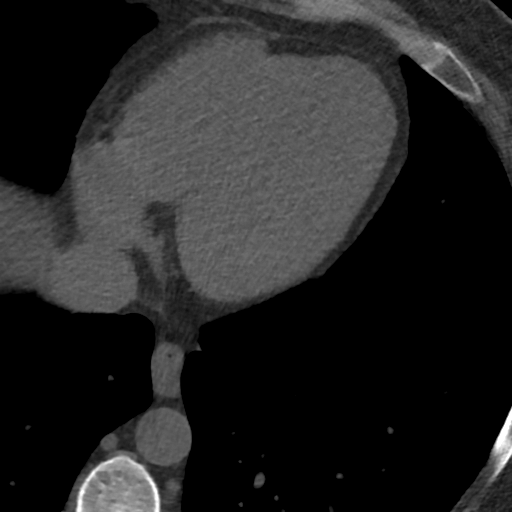
[im 10/46  lung]
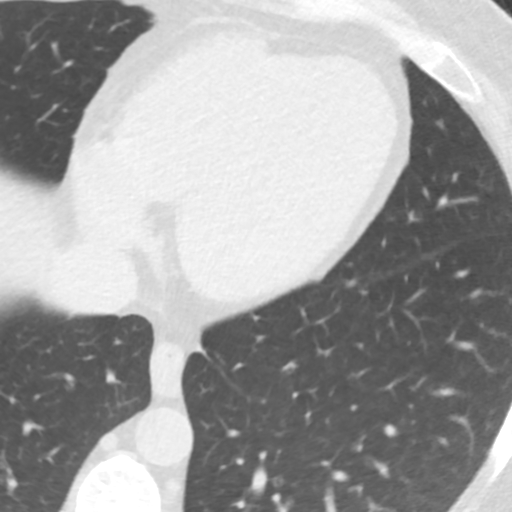
[im 19/46  vessel]
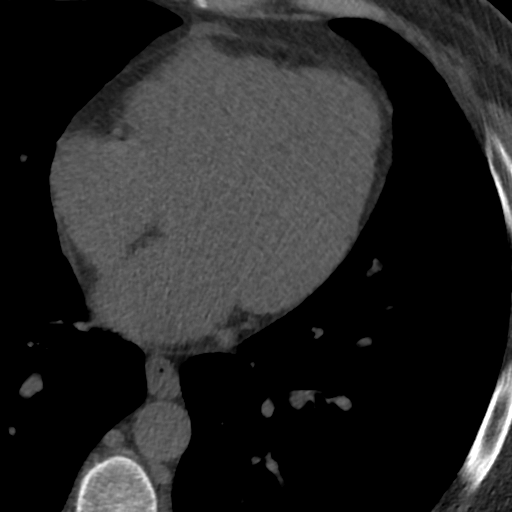
[im 28/46  vessel]
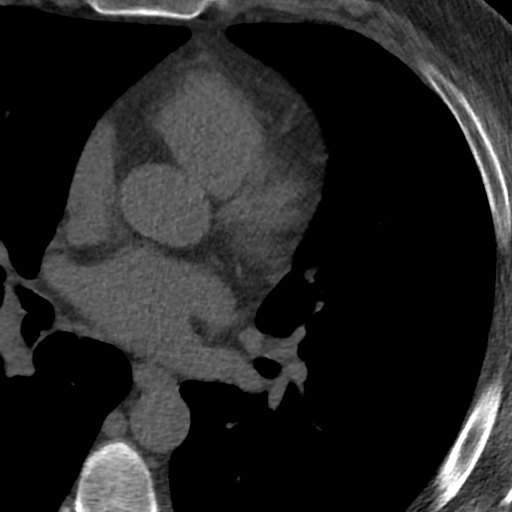
[im 37/46  vessel]
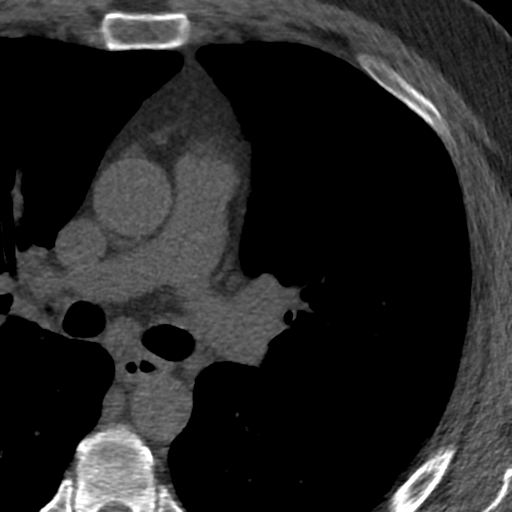

[Series 3: lung 67 % · axial · 0.76mm/px · z∈[-252,-159]mm · 5 of 47 slices shown]
[im 8/47  lung]
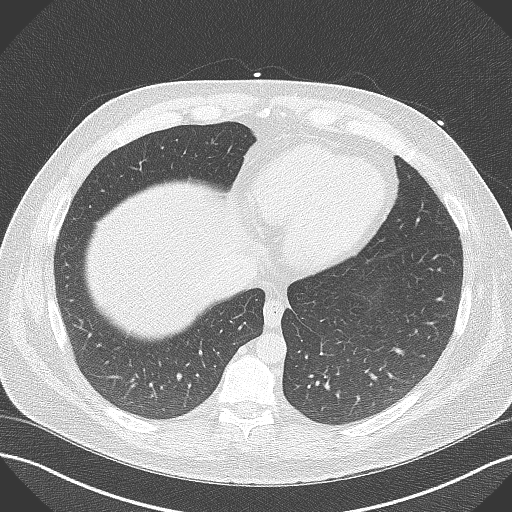
[im 16/47  lung]
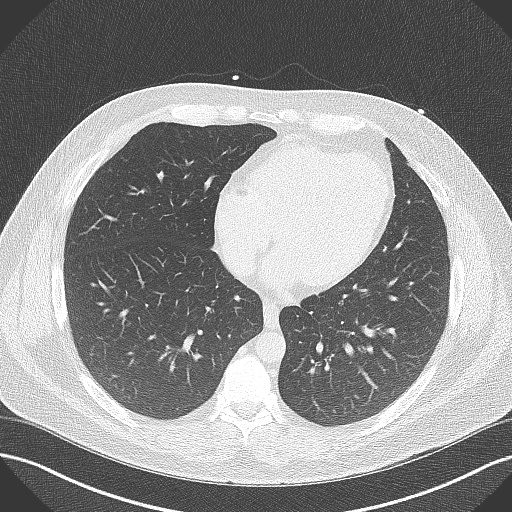
[im 24/47  lung]
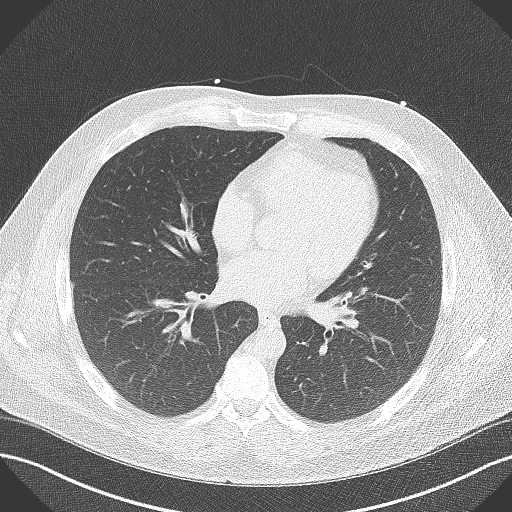
[im 31/47  lung]
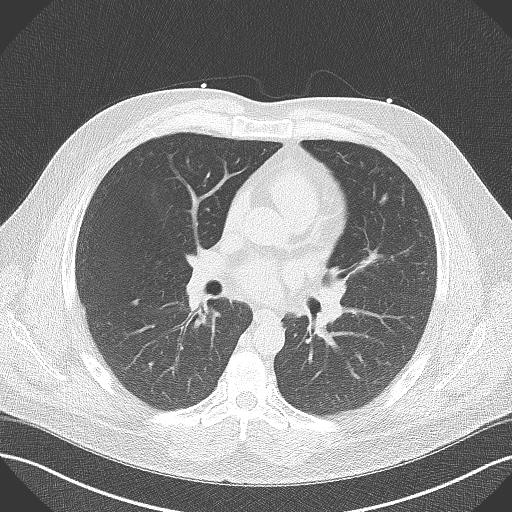
[im 39/47  lung]
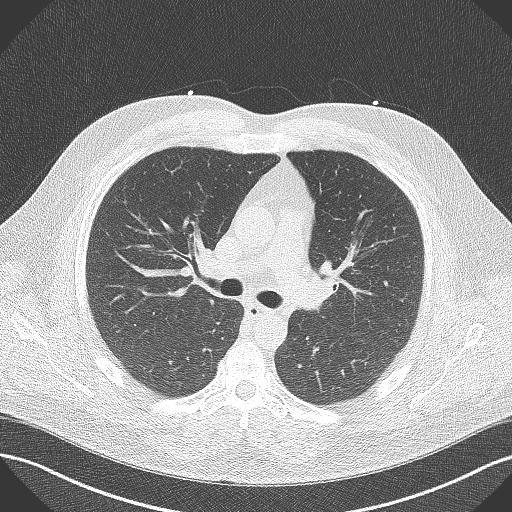

[Series 4: lung st 67 % · axial · 0.76mm/px · z∈[-252,-159]mm · 5 of 47 slices shown]
[im 8/47  lung]
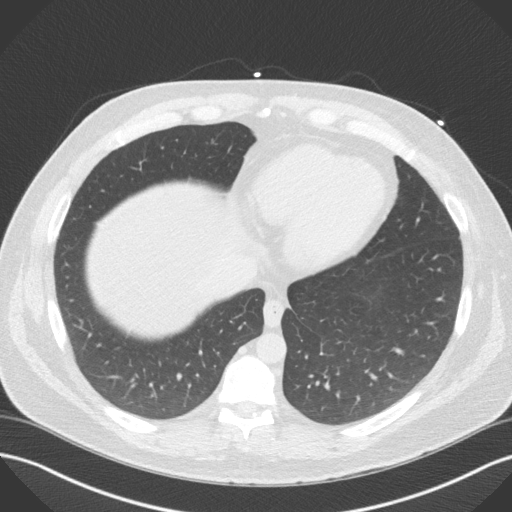
[im 16/47  lung]
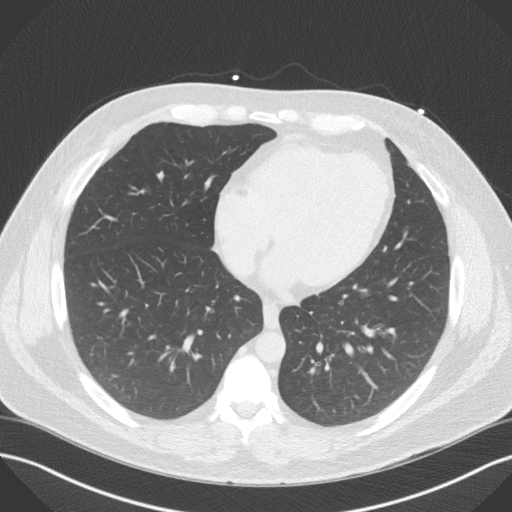
[im 24/47  lung]
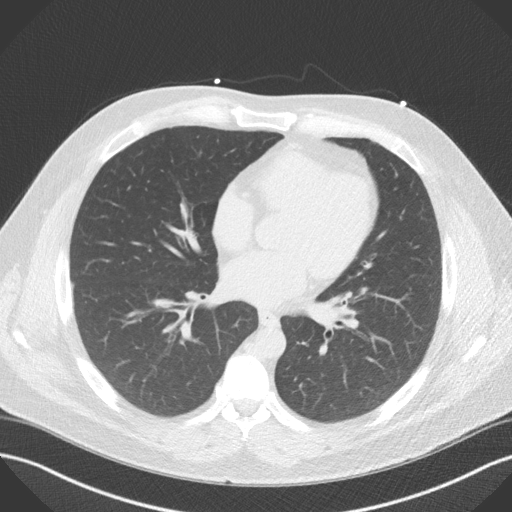
[im 31/47  lung]
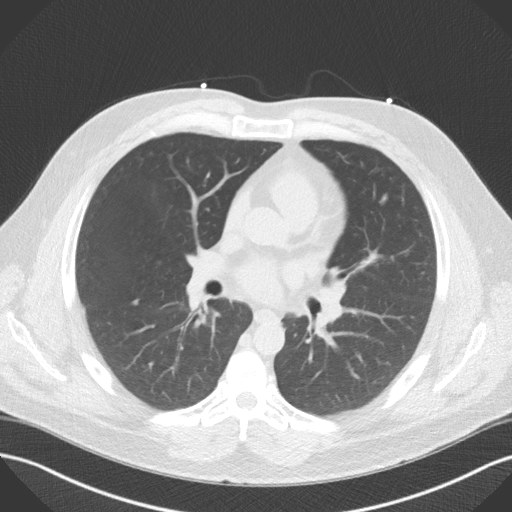
[im 39/47  lung]
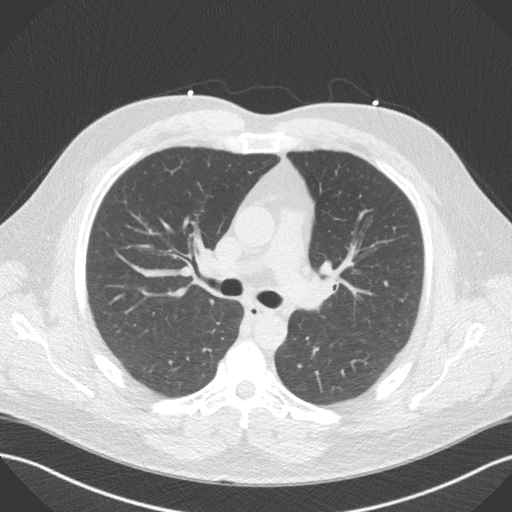

[14 of 20 positions shown; findings below may reference images not displayed]

FINDINGS: Vascular: Ascending thoracic aorta measures up to 3.0 cm. No
significant calcium in the visualized thoracic aorta. Heart size is
normal without significant pericardial fluid. Normal caliber of the
main pulmonary arteries.

Mediastinum/Nodes: Subcarinal tissue is mildly prominent measuring
up to 1.2 cm in the short axis. Right hilar tissue measures roughly
1.2 cm.

Lungs/Pleura: No large pleural effusions. Focal nodularity or
thickening along the right major fissure on sequence 3, image 17 and
sequence 6 image 45. This could represent a subpleural lymph node
but nonspecific and measures up to 5 mm. 3-4 mm nodule in the
anterior right chest near the right minor fissure on sequence 3,
image 25. No airspace disease or consolidation in the visualized
lungs.

Upper Abdomen: Diffusely low attenuation in the visualized liver
with Hounsfield units measuring roughly 20. Findings are compatible
with hepatic steatosis.

Musculoskeletal: Unremarkable.
IMPRESSION: 1. No acute abnormality.
2. Mildly prominent subcarinal tissue, measuring up to 1.2 cm.
Finding is nonspecific.
3. Two small pleural-based nodular densities, largest measuring up
to 5 mm. These small nodular densities are indeterminate. No
follow-up needed if patient is low-risk (and has no known or
suspected primary neoplasm). Non-contrast chest CT can be considered
in 12 months if patient is high-risk. This recommendation follows
the consensus statement: Guidelines for Management of Incidental
Pulmonary Nodules Detected on CT Images: From the [HOSPITAL]
4. Hepatic steatosis.
FINDINGS: Non-cardiac: See separate report from [REDACTED].

Ascending Aorta: Normal Caliber.  No Calcifications.

Pericardium: Normal

Coronary arteries: Normal coronary origins.
IMPRESSION: Coronary calcium score of 0. This was 0 percentile for age and sex
matched control.

Kotachi J Pando

*** End of Addendum ***
EXAM:
OVER-READ INTERPRETATION  CT CHEST

The following report is an over-read performed by radiologist Dr.
Deivy Facundo [REDACTED] on 08/02/2019. This over-read
does not include interpretation of cardiac or coronary anatomy or
pathology. The coronary calcium score interpretation by the
cardiologist is attached.
FINDINGS: Vascular: Ascending thoracic aorta measures up to 3.0 cm. No
significant calcium in the visualized thoracic aorta. Heart size is
normal without significant pericardial fluid. Normal caliber of the
main pulmonary arteries.

Mediastinum/Nodes: Subcarinal tissue is mildly prominent measuring
up to 1.2 cm in the short axis. Right hilar tissue measures roughly
1.2 cm.

Lungs/Pleura: No large pleural effusions. Focal nodularity or
thickening along the right major fissure on sequence 3, image 17 and
sequence 6 image 45. This could represent a subpleural lymph node
but nonspecific and measures up to 5 mm. 3-4 mm nodule in the
anterior right chest near the right minor fissure on sequence 3,
image 25. No airspace disease or consolidation in the visualized
lungs.

Upper Abdomen: Diffusely low attenuation in the visualized liver
with Hounsfield units measuring roughly 20. Findings are compatible
with hepatic steatosis.

Musculoskeletal: Unremarkable.
IMPRESSION: 1. No acute abnormality.
2. Mildly prominent subcarinal tissue, measuring up to 1.2 cm.
Finding is nonspecific.
3. Two small pleural-based nodular densities, largest measuring up
to 5 mm. These small nodular densities are indeterminate. No
follow-up needed if patient is low-risk (and has no known or
suspected primary neoplasm). Non-contrast chest CT can be considered
in 12 months if patient is high-risk. This recommendation follows
the consensus statement: Guidelines for Management of Incidental
Pulmonary Nodules Detected on CT Images: From the [HOSPITAL]
4. Hepatic steatosis.

## 2021-02-06 ENCOUNTER — Other Ambulatory Visit: Payer: Self-pay | Admitting: Cardiology

## 2021-02-14 ENCOUNTER — Other Ambulatory Visit: Payer: Self-pay | Admitting: Cardiology

## 2021-03-22 ENCOUNTER — Other Ambulatory Visit: Payer: Self-pay | Admitting: Cardiology

## 2021-04-24 ENCOUNTER — Other Ambulatory Visit: Payer: Self-pay | Admitting: Cardiology

## 2021-05-01 ENCOUNTER — Other Ambulatory Visit: Payer: Self-pay | Admitting: Cardiology

## 2021-05-15 DIAGNOSIS — K219 Gastro-esophageal reflux disease without esophagitis: Secondary | ICD-10-CM | POA: Insufficient documentation

## 2021-05-16 ENCOUNTER — Other Ambulatory Visit: Payer: Self-pay

## 2021-05-16 ENCOUNTER — Ambulatory Visit (INDEPENDENT_AMBULATORY_CARE_PROVIDER_SITE_OTHER): Payer: No Typology Code available for payment source | Admitting: Cardiology

## 2021-05-16 ENCOUNTER — Encounter: Payer: Self-pay | Admitting: Cardiology

## 2021-05-16 VITALS — BP 138/80 | HR 72 | Ht 70.0 in | Wt 256.0 lb

## 2021-05-16 DIAGNOSIS — R0609 Other forms of dyspnea: Secondary | ICD-10-CM | POA: Diagnosis not present

## 2021-05-16 DIAGNOSIS — E785 Hyperlipidemia, unspecified: Secondary | ICD-10-CM

## 2021-05-16 DIAGNOSIS — I10 Essential (primary) hypertension: Secondary | ICD-10-CM

## 2021-05-16 DIAGNOSIS — K219 Gastro-esophageal reflux disease without esophagitis: Secondary | ICD-10-CM

## 2021-05-16 DIAGNOSIS — R0789 Other chest pain: Secondary | ICD-10-CM

## 2021-05-16 NOTE — Progress Notes (Signed)
Cardiology Office Note:    Date:  05/16/2021   ID:  Caryl Never, DOB 11/15/1975, MRN [id number]  PCP:  Noni Saupe, MD  Cardiologist:  Gypsy Balsam, MD    Referring MD: Noni Saupe, MD   Chief Complaint  Patient presents with   Medication Management  Doing very well  History of Present Illness:    Ernest Lawson is a 45 y.o. male  with atypical chest pain pain is located left side of his chest typically not related to exercise.  Years ago he got blunt trauma to his chest and he thinks that may be related to it.  Recently he end up going to the emergency because of this chest pain.  Again did happen at rest 2 troponins were negative while check in the emergency room, no acute EKG changes.  He was however noted to have high blood pressure.  Is coming today to my office because finally he would like to know where the chest pain is coming from.  We were hoping that we will be able to do stress echocardiogram with him, however, because of coronavirus situation we are not able to do it I think there is some value on doing calcium score. He eventually end up having sleep study done.  He was found to have significant sleep apnea.  Calcium score has been performed as well his calcium score is 0. He comes today to my office for follow-up of Very well.  He denies have any chest pain tightness squeezing pressure burning chest.  He works physically in [employer] business have no difficulty doing it.  Past Medical History:  Diagnosis Date   Acid reflux     Past Surgical History:  Procedure Laterality Date   FOOT SURGERY      Current Medications: Current Meds  Medication Sig   aspirin EC 81 MG tablet Take 81 mg by mouth daily.   esomeprazole (NEXIUM) 20 MG capsule Take 20 mg by mouth daily before breakfast.   metoprolol succinate (TOPROL-XL) 25 MG 24 hr tablet TAKE 1 TABLET DAILY WITH MEAL OR IMMEDIATELY AFTER A MEAL *MUST HAVE APT FOR ANY FUTURE REFILLS* (Patient taking  differently: Take 25 mg by mouth daily.)   nitroGLYCERIN (NITROSTAT) 0.4 MG SL tablet Place 1 tablet under the tongue as needed for chest pain.     Allergies:   Chantix [varenicline tartrate]   Social History   Socioeconomic History   Marital status: Married    Spouse name: Judeth Cornfield   Number of children: Not on file   Years of education: Not on file   Highest education level: 11th grade  Occupational History   Occupation: pipe fitter    Comment: [employer]  Tobacco Use   Smoking status: Some Days    Types: Cigarettes   Smokeless tobacco: Never  Substance and Sexual Activity   Alcohol use: Yes    Alcohol/week: 2.0 standard drinks    Types: 1 Cans of beer, 1 Shots of liquor per week    Comment: 1-2 beers or cocktails a week   Drug use: No   Sexual activity: Not on file  Other Topics Concern   Not on file  Social History Narrative   Patient is right-handed. He is married with 2 children. He drinks 2 cups of coffee a day, and occassionally tea or soda, He is very active at work.   Social Determinants of Health   Financial Resource Strain: Not on file  Food Insecurity:  Not on file  Transportation Needs: Not on file  Physical Activity: Not on file  Stress: Not on file  Social Connections: Not on file     Family History: The patient's family history includes High blood pressure in his mother; Stroke in his father; Throat cancer in his mother. ROS:   Please see the history of present illness.    All 14 point review of systems negative except as described per history of present illness  EKGs/Labs/Other Studies Reviewed:      Recent Labs: No results found for requested labs within last 8760 hours.  Recent Lipid Panel No results found for: CHOL, TRIG, HDL, CHOLHDL, VLDL, LDLCALC, LDLDIRECT  Physical Exam:    VS:  BP 138/80 (BP Location: Left Arm, Patient Position: Sitting)   Pulse 72   Ht 5\' 10"  (1.778 m)   Wt 256 lb (116.1 kg)   SpO2 96%   BMI 36.73 kg/m      Wt Readings from Last 3 Encounters:  05/16/21 256 lb (116.1 kg)  02/08/20 248 lb 12.8 oz (112.9 kg)  09/30/19 230 lb (104.3 kg)     GEN:  Well nourished, well developed in no acute distress HEENT: Normal NECK: No JVD; No carotid bruits LYMPHATICS: No lymphadenopathy CARDIAC: RRR, no murmurs, no rubs, no gallops RESPIRATORY:  Clear to auscultation without rales, wheezing or rhonchi  ABDOMEN: Soft, non-tender, non-distended MUSCULOSKELETAL:  No edema; No deformity  SKIN: Warm and dry LOWER EXTREMITIES: no swelling NEUROLOGIC:  Alert and oriented x 3 PSYCHIATRIC:  Normal affect   ASSESSMENT:    1. Essential hypertension   2. Atypical chest pain   3. Dyspnea on exertion   4. Gastroesophageal reflux disease, unspecified whether esophagitis present    PLAN:    In order of problems listed above:  Essential hypertension, blood pressure is top normal today.  I will continue watching the situation asked to check blood pressure on the regular basis let me know if his blood pressure will be more than 130/80 persistently. Dyslipidemia I did review his K PN which show me his LDL of 105 HDL 44 this is from January of this year.  He is being put on cholesterol medication by his primary care physician.  I will ask him to have fasting lipid profile done to check his cholesterol. Dyspnea on exertion, denies having any. Gastroesophageal reflux disease seems to be stable   Medication Adjustments/Labs and Tests Ordered: Current medicines are reviewed at length with the patient today.  Concerns regarding medicines are outlined above.  Orders Placed This Encounter  Procedures   Lipid panel   EKG 12-Lead   Medication changes: No orders of the defined types were placed in this encounter.   Signed, February, MD, [facility] 05/16/2021 2:00 PM    [facility]

## 2021-05-16 NOTE — Patient Instructions (Signed)
Medication Instructions:  Your physician recommends that you continue on your current medications as directed. Please refer to the Current Medication list given to you today.  *If you need a refill on your cardiac medications before your next appointment, please call your pharmacy*   Lab Work: Your physician recommends that you return for lab work today: lipid  If you have labs (blood work) drawn today and your tests are completely normal, you will receive your results only by: MyChart Message (if you have MyChart) OR A paper copy in the mail If you have any lab test that is abnormal or we need to change your treatment, we will call you to review the results.   Testing/Procedures: None   Follow-Up: At [facility], you and your health needs are our priority.  As part of our continuing mission to provide you with exceptional heart care, we have created designated Provider Care Teams.  These Care Teams include your primary Cardiologist (physician) and Advanced Practice Providers (APPs -  Physician Assistants and Nurse Practitioners) who all work together to provide you with the care you need, when you need it.  We recommend signing up for the patient portal called "MyChart".  Sign up information is provided on this After Visit Summary.  MyChart is used to connect with patients for Virtual Visits (Telemedicine).  Patients are able to view lab/test results, encounter notes, upcoming appointments, etc.  Non-urgent messages can be sent to your provider as well.   To learn more about what you can do with MyChart, go to [email or web address].    Your next appointment:   1 year(s)  The format for your next appointment:   In Person  Provider:   Robert Krasowski, MD   Other Instructions    

## 2021-05-17 ENCOUNTER — Telehealth: Payer: Self-pay | Admitting: Cardiology

## 2021-05-17 LAB — LIPID PANEL
Chol/HDL Ratio: 4.9 ratio (ref 0.0–5.0)
Cholesterol, Total: 240 mg/dL — ABNORMAL HIGH (ref 100–199)
HDL: 49 mg/dL (ref >39)
LDL Chol Calc (NIH): 150 mg/dL — ABNORMAL HIGH (ref 0–99)
Triglycerides: 224 mg/dL — ABNORMAL HIGH (ref 0–149)
VLDL Cholesterol Cal: 41 mg/dL — ABNORMAL HIGH (ref 5–40)

## 2021-05-17 MED ORDER — METOPROLOL SUCCINATE ER 25 MG PO TB24: 25.0000 mg | ORAL_TABLET | Freq: Every day | ORAL | 3 refills | Status: DC

## 2021-05-17 MED ORDER — ATORVASTATIN CALCIUM 10 MG PO TABS: 10.0000 mg | ORAL_TABLET | Freq: Every day | ORAL | 3 refills | Status: DC

## 2021-05-17 NOTE — Telephone Encounter (Signed)
Patient returning call for lab results. 

## 2021-05-17 NOTE — Addendum Note (Signed)
Addended by: Eleonore Chiquito on: 05/17/2021 02:04 PM   Modules accepted: Orders

## 2021-05-17 NOTE — Telephone Encounter (Signed)
Results reviewed with pt as per Dr. Krasowski's note.  Pt verbalized understanding and had no additional questions. Routed to PCP  

## 2022-02-10 ENCOUNTER — Other Ambulatory Visit: Payer: Self-pay | Admitting: Cardiology

## 2023-04-28 ENCOUNTER — Other Ambulatory Visit: Payer: Self-pay | Admitting: Cardiology

## 2023-05-27 ENCOUNTER — Other Ambulatory Visit: Payer: Self-pay | Admitting: Cardiology

## 2023-07-05 ENCOUNTER — Other Ambulatory Visit: Payer: Self-pay | Admitting: Cardiology

## 2023-07-29 ENCOUNTER — Other Ambulatory Visit: Payer: Self-pay | Admitting: Cardiology

## 2023-08-06 ENCOUNTER — Telehealth: Payer: Self-pay | Admitting: Cardiology

## 2023-08-06 ENCOUNTER — Other Ambulatory Visit: Payer: Self-pay

## 2023-08-06 MED ORDER — METOPROLOL SUCCINATE ER 25 MG PO TB24: 25.0000 mg | ORAL_TABLET | Freq: Every day | ORAL | 0 refills | Status: DC

## 2023-08-06 MED ORDER — ATORVASTATIN CALCIUM 10 MG PO TABS: 10.0000 mg | ORAL_TABLET | Freq: Every day | ORAL | 0 refills | Status: DC

## 2023-08-06 NOTE — Telephone Encounter (Signed)
*  STAT* If patient is at the pharmacy, call can be transferred to refill team.   1. Which medications need to be refilled? (please list name of each medication and dose if known) atorvastatin  (LIPITOR) 10 MG tablet    metoprolol  succinate (TOPROL -XL) 25 MG 24 hr tablet    2. Which pharmacy/location (including street and city if local pharmacy) is medication to be sent to?  [id number] - [place], [place] - [address]    3. Do they need a 30 day or 90 day supply? 90   Patient was has appt on 2/26

## 2023-08-27 ENCOUNTER — Encounter: Payer: Self-pay | Admitting: Cardiology

## 2023-08-27 ENCOUNTER — Ambulatory Visit: Payer: No Typology Code available for payment source | Attending: Cardiology | Admitting: Cardiology

## 2023-08-27 VITALS — BP 160/90 | HR 78 | Ht 69.0 in | Wt 267.0 lb

## 2023-08-27 DIAGNOSIS — Z1329 Encounter for screening for other suspected endocrine disorder: Secondary | ICD-10-CM | POA: Diagnosis not present

## 2023-08-27 DIAGNOSIS — I10 Essential (primary) hypertension: Secondary | ICD-10-CM | POA: Diagnosis not present

## 2023-08-27 DIAGNOSIS — F17210 Nicotine dependence, cigarettes, uncomplicated: Secondary | ICD-10-CM | POA: Diagnosis not present

## 2023-08-27 DIAGNOSIS — Z13220 Encounter for screening for lipoid disorders: Secondary | ICD-10-CM

## 2023-08-27 DIAGNOSIS — Z131 Encounter for screening for diabetes mellitus: Secondary | ICD-10-CM

## 2023-08-27 DIAGNOSIS — E782 Mixed hyperlipidemia: Secondary | ICD-10-CM

## 2023-08-27 DIAGNOSIS — Z1321 Encounter for screening for nutritional disorder: Secondary | ICD-10-CM

## 2023-08-27 MED ORDER — ATORVASTATIN CALCIUM 10 MG PO TABS: 10.0000 mg | ORAL_TABLET | Freq: Every day | ORAL | 3 refills | Status: AC

## 2023-08-27 MED ORDER — METOPROLOL SUCCINATE ER 25 MG PO TB24: 25.0000 mg | ORAL_TABLET | Freq: Every day | ORAL | 3 refills | Status: AC

## 2023-08-27 MED ORDER — NITROGLYCERIN 0.4 MG SL SUBL: 0.4000 mg | SUBLINGUAL_TABLET | SUBLINGUAL | 11 refills | Status: AC | PRN

## 2023-08-27 NOTE — Patient Instructions (Addendum)
 Medication Instructions:  Your physician has recommended you make the following change in your medication:   Stop aspirin  *If you need a refill on your cardiac medications before your next appointment, please call your pharmacy*   Lab Work: Your physician recommends that you have a CMP, CBC, TSH, A1C, vitamin D and lipids today in the office.  If you have labs (blood work) drawn today and your tests are completely normal, you will receive your results only by: MyChart Message (if you have MyChart) OR A paper copy in the mail If you have any lab test that is abnormal or we need to change your treatment, we will call you to review the results.   Testing/Procedures: None ordered   Follow-Up: At [facility], you and your health needs are our priority.  As part of our continuing mission to provide you with exceptional heart care, we have created designated Provider Care Teams.  These Care Teams include your primary Cardiologist (physician) and Advanced Practice Providers (APPs -  Physician Assistants and Nurse Practitioners) who all work together to provide you with the care you need, when you need it.  We recommend signing up for the patient portal called "MyChart".  Sign up information is provided on this After Visit Summary.  MyChart is used to connect with patients for Virtual Visits (Telemedicine).  Patients are able to view lab/test results, encounter notes, upcoming appointments, etc.  Non-urgent messages can be sent to your provider as well.   To learn more about what you can do with MyChart, go to [email or web address].    Your next appointment:   12 month(s)  The format for your next appointment:   In Person  Provider:   Belva Crome, MD    Other Instructions none  Important Information About Sugar

## 2023-08-27 NOTE — Progress Notes (Signed)
 Cardiology Office Note:    Date:  08/27/2023   ID:  Ernest Lawson, DOB Nov 13, 1975, MRN [id number]  PCP:  Noni Saupe, MD  Cardiologist:  Garwin Brothers, MD   Referring MD: Noni Saupe, MD    ASSESSMENT:    1. Essential hypertension   2. Screening cholesterol level   3. Thyroid disorder screen   4. Diabetes mellitus screening   5. Encounter for vitamin deficiency screening   6. Cigarette smoker   7. Mixed hyperlipidemia   8. Morbid obesity (HCC)    PLAN:    In order of problems listed above:  Primary prevention stressed with the patient.  Importance of compliance with diet medication stressed and patient verbalized standing. He was advised to walk at least half an hour a day 5 days a week and he promises to do so. Essential hypertension: Blood pressure stable and diet was emphasized.  Lifestyle modification urged.  He has an element of whitecoat hypertension.  Diet, lifestyle modification, salt intake issues discussed. Mixed dyslipidemia: Markedly elevated lipids.  Will do lipid check today and advise him accordingly.  Diet emphasized. Morbid obesity: Weight reduction stressed risks of obesity explained and he promises to do better. Since cigarette smoker: I spent 5 minutes with the patient discussing solely about smoking. Smoking cessation was counseled. I suggested to the patient also different medications and pharmacological interventions. Patient is keen to try stopping on its own at this time. He will get back to me if he needs any further assistance in this matter. Patient will be seen in follow-up appointment in 6 months or earlier if the patient has any concerns.    Medication Adjustments/Labs and Tests Ordered: Current medicines are reviewed at length with the patient today.  Concerns regarding medicines are outlined above.  Orders Placed This Encounter  Procedures   CBC   Comprehensive metabolic panel   Hemoglobin A1c   Lipid panel   TSH    VITAMIN D 25 Hydroxy (Vit-D Deficiency, Fractures)   EKG 12-Lead   Meds ordered this encounter  Medications   atorvastatin (LIPITOR) 10 MG tablet    Sig: Take 1 tablet (10 mg total) by mouth daily.    Dispense:  90 tablet    Refill:  3   metoprolol succinate (TOPROL-XL) 25 MG 24 hr tablet    Sig: Take 1 tablet (25 mg total) by mouth daily.    Dispense:  90 tablet    Refill:  3    t   nitroGLYCERIN (NITROSTAT) 0.4 MG SL tablet    Sig: Place 1 tablet (0.4 mg total) under the tongue as needed for chest pain.    Dispense:  25 tablet    Refill:  11     No chief complaint on file.    History of Present Illness:    Ernest Lawson is a 48 y.o. male.  Patient has past medical history of a calcium score of 0, essential hypertension, mixed dyslipidemia, morbid obesity.  He leads a sedentary lifestyle.  Unfortunately continues to smoke.  He denies any chest pain orthopnea or PND.  He is here for routine follow-up.  He gives history of obstructive sleep apnea followed by primary care.  At the time of my evaluation, the patient is alert awake oriented and in no distress.  Past Medical History:  Diagnosis Date   Acid reflux    Atypical chest pain 11/09/2018   Dyspnea on exertion 11/09/2018   Essential hypertension 11/09/2018  Snoring 11/09/2018    Past Surgical History:  Procedure Laterality Date   FOOT SURGERY      Current Medications: Current Meds  Medication Sig   esomeprazole (NEXIUM) 20 MG capsule Take 20 mg by mouth as needed (heartburn).   [DISCONTINUED] aspirin EC 81 MG tablet Take 81 mg by mouth daily.   [DISCONTINUED] atorvastatin (LIPITOR) 10 MG tablet Take 1 tablet (10 mg total) by mouth daily.   [DISCONTINUED] metoprolol succinate (TOPROL-XL) 25 MG 24 hr tablet Take 1 tablet (25 mg total) by mouth daily.   [DISCONTINUED] nitroGLYCERIN (NITROSTAT) 0.4 MG SL tablet Place 1 tablet under the tongue as needed for chest pain.     Allergies:   Chantix [varenicline tartrate]    Social History   Socioeconomic History   Marital status: Married    Spouse name: Judeth Cornfield   Number of children: Not on file   Years of education: Not on file   Highest education level: 11th grade  Occupational History   Occupation: pipe fitter    Comment: [employer]  Tobacco Use   Smoking status: Some Days    Types: Cigarettes   Smokeless tobacco: Never  Substance and Sexual Activity   Alcohol use: Yes    Alcohol/week: 2.0 standard drinks of alcohol    Types: 1 Cans of beer, 1 Shots of liquor per week    Comment: 1-2 beers or cocktails a week   Drug use: No   Sexual activity: Not on file  Other Topics Concern   Not on file  Social History Narrative   Patient is right-handed. He is married with 2 children. He drinks 2 cups of coffee a day, and occassionally tea or soda, He is very active at work.   Social Drivers of Corporate investment banker Strain: Not on file  Food Insecurity: Not on file  Transportation Needs: Not on file  Physical Activity: Not on file  Stress: Not on file  Social Connections: Not on file     Family History: The patient's family history includes High blood pressure in his mother; Stroke in his father; Throat cancer in his mother.  ROS:   Please see the history of present illness.    All other systems reviewed and are negative.  EKGs/Labs/Other Studies Reviewed:    The following studies were reviewed today: .Marland KitchenEKG Interpretation Date/Time:  Wednesday August 27 2023 08:30:31 EST Ventricular Rate:  78 PR Interval:  160 QRS Duration:  90 QT Interval:  362 QTC Calculation: 412 R Axis:   109  Text Interpretation: Normal sinus rhythm Rightward axis Cannot rule out Anterior infarct , age undetermined Abnormal ECG When compared with ECG of 07-May-2017 09:05, QRS axis Shifted right T wave inversion now evident in Inferior leads Confirmed by Belva Crome [phone]) on 08/27/2023 8:46:51 AM     Recent Labs: No results found for  requested labs within last 365 days.  Recent Lipid Panel    Component Value Date/Time   CHOL 240 (H) 05/16/2021 1354   TRIG 224 (H) 05/16/2021 1354   HDL 49 05/16/2021 1354   CHOLHDL 4.9 05/16/2021 1354   LDLCALC 150 (H) 05/16/2021 1354    Physical Exam:    VS:  BP (!) 148/100   Pulse 78   Ht 5\' 9"  (1.753 m)   Wt 267 lb (121.1 kg)   SpO2 98%   BMI 39.43 kg/m     Wt Readings from Last 3 Encounters:  08/27/23 267 lb (121.1 kg)  05/16/21  256 lb (116.1 kg)  02/08/20 248 lb 12.8 oz (112.9 kg)     GEN: Patient is in no acute distress HEENT: Normal NECK: No JVD; No carotid bruits LYMPHATICS: No lymphadenopathy CARDIAC: Hear sounds regular, 2/6 systolic murmur at the apex. RESPIRATORY:  Clear to auscultation without rales, wheezing or rhonchi  ABDOMEN: Soft, non-tender, non-distended MUSCULOSKELETAL:  No edema; No deformity  SKIN: Warm and dry NEUROLOGIC:  Alert and oriented x 3 PSYCHIATRIC:  Normal affect   Signed, Garwin Brothers, MD  08/27/2023 9:02 AM    [facility]

## 2023-08-28 ENCOUNTER — Telehealth: Payer: Self-pay

## 2023-08-28 LAB — COMPREHENSIVE METABOLIC PANEL
ALT: 41 [IU]/L (ref 0–44)
AST: 25 [IU]/L (ref 0–40)
Albumin: 4.8 g/dL (ref 4.1–5.1)
Alkaline Phosphatase: 77 [IU]/L (ref 44–121)
BUN/Creatinine Ratio: 16 (ref 9–20)
BUN: 11 mg/dL (ref 6–24)
Bilirubin Total: 0.3 mg/dL (ref 0.0–1.2)
CO2: 23 mmol/L (ref 20–29)
Calcium: 10.3 mg/dL — ABNORMAL HIGH (ref 8.7–10.2)
Chloride: 101 mmol/L (ref 96–106)
Creatinine, Ser: 0.68 mg/dL — ABNORMAL LOW (ref 0.76–1.27)
Globulin, Total: 2.6 g/dL (ref 1.5–4.5)
Glucose: 106 mg/dL — ABNORMAL HIGH (ref 70–99)
Potassium: 4.5 mmol/L (ref 3.5–5.2)
Sodium: 138 mmol/L (ref 134–144)
Total Protein: 7.4 g/dL (ref 6.0–8.5)
eGFR: 115 mL/min/{1.73_m2} (ref >59)

## 2023-08-28 LAB — CBC
Hematocrit: 44.3 % (ref 37.5–51.0)
Hemoglobin: 14.6 g/dL (ref 13.0–17.7)
MCH: 29.8 pg (ref 26.6–33.0)
MCHC: 33 g/dL (ref 31.5–35.7)
MCV: 90 fL (ref 79–97)
Platelets: 178 10*3/uL (ref 150–450)
RBC: 4.9 x10E6/uL (ref 4.14–5.80)
RDW: 12.9 % (ref 11.6–15.4)
WBC: 9.2 10*3/uL (ref 3.4–10.8)

## 2023-08-28 LAB — LIPID PANEL
Chol/HDL Ratio: 4 {ratio} (ref 0.0–5.0)
Cholesterol, Total: 165 mg/dL (ref 100–199)
HDL: 41 mg/dL (ref >39)
LDL Chol Calc (NIH): 74 mg/dL (ref 0–99)
Triglycerides: 312 mg/dL — ABNORMAL HIGH (ref 0–149)
VLDL Cholesterol Cal: 50 mg/dL — ABNORMAL HIGH (ref 5–40)

## 2023-08-28 LAB — TSH: TSH: 3.42 u[IU]/mL (ref 0.450–4.500)

## 2023-08-28 LAB — HEMOGLOBIN A1C
Est. average glucose Bld gHb Est-mCnc: 134 mg/dL
Hgb A1c MFr Bld: 6.3 % — ABNORMAL HIGH (ref 4.8–5.6)

## 2023-08-28 LAB — VITAMIN D 25 HYDROXY (VIT D DEFICIENCY, FRACTURES): Vit D, 25-Hydroxy: 23.3 ng/mL — ABNORMAL LOW (ref 30.0–100.0)

## 2023-08-28 NOTE — Telephone Encounter (Signed)
 Left vm to return call.

## 2023-08-28 NOTE — Telephone Encounter (Signed)
-----   Message from [place] Revankar sent at 08/28/2023  9:46 AM EST ----- Fish oil 2 g twice daily.  Diet low in carbs.  Diet and exercise.  Needs to talk to primary care about low vitamin D.  Copy primary care Garwin Brothers, MD 08/28/2023 9:44 AM

## 2023-09-04 MED ORDER — FISH OIL 1000 MG PO CAPS: 2.0000 | ORAL_CAPSULE | Freq: Two times a day (BID) | ORAL | Status: AC

## 2023-09-04 NOTE — Telephone Encounter (Signed)
 Patient returned RN's call regarding lab results,.

## 2023-09-04 NOTE — Addendum Note (Signed)
 Addended by: Eleonore Chiquito on: 09/04/2023 11:28 AM   Modules accepted: Orders

## 2023-09-04 NOTE — Telephone Encounter (Signed)
Results reviewed with pt as per Dr. Revankar's note.  Pt verbalized understanding and had no additional questions. Routed to PCP.
